# Patient Record
Sex: Female | Born: 1980 | Race: White | Hispanic: No | Marital: Married | Smoking: Current every day smoker
Health system: Southern US, Community
[De-identification: ages and names within clinical notes are randomized; demographics above are authoritative.]

## PROBLEM LIST (undated history)

## (undated) DIAGNOSIS — Z8719 Personal history of other diseases of the digestive system: Secondary | ICD-10-CM

## (undated) DIAGNOSIS — N939 Abnormal uterine and vaginal bleeding, unspecified: Secondary | ICD-10-CM

## (undated) DIAGNOSIS — J309 Allergic rhinitis, unspecified: Secondary | ICD-10-CM

## (undated) DIAGNOSIS — E669 Obesity, unspecified: Secondary | ICD-10-CM

## (undated) DIAGNOSIS — Z720 Tobacco use: Secondary | ICD-10-CM

## (undated) DIAGNOSIS — Z87898 Personal history of other specified conditions: Secondary | ICD-10-CM

## (undated) DIAGNOSIS — Z8619 Personal history of other infectious and parasitic diseases: Secondary | ICD-10-CM

## (undated) DIAGNOSIS — K635 Polyp of colon: Secondary | ICD-10-CM

## (undated) DIAGNOSIS — Z860100 Personal history of colon polyps, unspecified: Secondary | ICD-10-CM

## (undated) DIAGNOSIS — R519 Headache, unspecified: Secondary | ICD-10-CM

## (undated) DIAGNOSIS — G43909 Migraine, unspecified, not intractable, without status migrainosus: Secondary | ICD-10-CM

## (undated) DIAGNOSIS — Z8669 Personal history of other diseases of the nervous system and sense organs: Secondary | ICD-10-CM

## (undated) DIAGNOSIS — K602 Anal fissure, unspecified: Secondary | ICD-10-CM

## (undated) DIAGNOSIS — N3010 Interstitial cystitis (chronic) without hematuria: Secondary | ICD-10-CM

## (undated) DIAGNOSIS — K219 Gastro-esophageal reflux disease without esophagitis: Secondary | ICD-10-CM

## (undated) DIAGNOSIS — R002 Palpitations: Principal | ICD-10-CM

## (undated) DIAGNOSIS — Z9109 Other allergy status, other than to drugs and biological substances: Secondary | ICD-10-CM

## (undated) DIAGNOSIS — G8929 Other chronic pain: Secondary | ICD-10-CM

## (undated) DIAGNOSIS — K589 Irritable bowel syndrome without diarrhea: Secondary | ICD-10-CM

## (undated) DIAGNOSIS — I10 Essential (primary) hypertension: Secondary | ICD-10-CM

## (undated) DIAGNOSIS — Z973 Presence of spectacles and contact lenses: Secondary | ICD-10-CM

## (undated) DIAGNOSIS — R030 Elevated blood-pressure reading, without diagnosis of hypertension: Secondary | ICD-10-CM

## (undated) DIAGNOSIS — E782 Mixed hyperlipidemia: Principal | ICD-10-CM

## (undated) DIAGNOSIS — F419 Anxiety disorder, unspecified: Secondary | ICD-10-CM

## (undated) HISTORY — DX: Allergic rhinitis, unspecified: J30.9

## (undated) HISTORY — DX: Headache, unspecified: R51.9

## (undated) HISTORY — DX: Obesity, unspecified: E66.9

## (undated) HISTORY — DX: Essential (primary) hypertension: I10

## (undated) HISTORY — DX: Palpitations: R00.2

## (undated) HISTORY — DX: Other chronic pain: G89.29

## (undated) HISTORY — PX: COLONOSCOPY WITH ESOPHAGOGASTRODUODENOSCOPY (EGD): SHX5779

## (undated) HISTORY — DX: Polyp of colon: K63.5

## (undated) HISTORY — DX: Gastro-esophageal reflux disease without esophagitis: K21.9

## (undated) HISTORY — DX: Elevated blood-pressure reading, without diagnosis of hypertension: R03.0

## (undated) HISTORY — DX: Anal fissure, unspecified: K60.2

## (undated) HISTORY — DX: Mixed hyperlipidemia: E78.2

## (undated) HISTORY — DX: Personal history of other infectious and parasitic diseases: Z86.19

## (undated) HISTORY — DX: Tobacco use: Z72.0

## (undated) HISTORY — DX: Migraine, unspecified, not intractable, without status migrainosus: G43.909

## (undated) HISTORY — DX: Anxiety disorder, unspecified: F41.9

---
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
## 1999-01-12 ENCOUNTER — Emergency Department (HOSPITAL_COMMUNITY): Admission: EM | Admit: 1999-01-12 | Discharge: 1999-01-12 | Payer: Self-pay | Admitting: Emergency Medicine

## 1999-01-24 ENCOUNTER — Emergency Department (HOSPITAL_COMMUNITY): Admission: EM | Admit: 1999-01-24 | Discharge: 1999-01-24 | Payer: Self-pay | Admitting: Emergency Medicine

## 2000-07-28 ENCOUNTER — Encounter: Payer: Self-pay | Admitting: Family Medicine

## 2000-07-28 ENCOUNTER — Encounter: Admission: RE | Admit: 2000-07-28 | Discharge: 2000-07-28 | Payer: Self-pay | Admitting: Oncology

## 2002-01-31 ENCOUNTER — Encounter: Payer: Self-pay | Admitting: Emergency Medicine

## 2002-01-31 ENCOUNTER — Emergency Department (HOSPITAL_COMMUNITY): Admission: EM | Admit: 2002-01-31 | Discharge: 2002-01-31 | Payer: Self-pay | Admitting: Emergency Medicine

## 2002-02-26 ENCOUNTER — Emergency Department (HOSPITAL_COMMUNITY): Admission: EM | Admit: 2002-02-26 | Discharge: 2002-02-26 | Payer: Self-pay | Admitting: Emergency Medicine

## 2003-08-23 ENCOUNTER — Other Ambulatory Visit: Admission: RE | Admit: 2003-08-23 | Discharge: 2003-08-23 | Payer: Self-pay | Admitting: Obstetrics and Gynecology

## 2004-09-01 ENCOUNTER — Other Ambulatory Visit: Admission: RE | Admit: 2004-09-01 | Discharge: 2004-09-01 | Payer: Self-pay | Admitting: Obstetrics and Gynecology

## 2007-02-26 ENCOUNTER — Inpatient Hospital Stay (HOSPITAL_COMMUNITY): Admission: AD | Admit: 2007-02-26 | Discharge: 2007-03-02 | Payer: Self-pay | Admitting: Obstetrics & Gynecology

## 2009-04-10 ENCOUNTER — Inpatient Hospital Stay (HOSPITAL_COMMUNITY): Admission: RE | Admit: 2009-04-10 | Discharge: 2009-04-13 | Payer: Self-pay | Admitting: Obstetrics and Gynecology

## 2009-12-10 ENCOUNTER — Emergency Department (HOSPITAL_COMMUNITY): Admission: EM | Admit: 2009-12-10 | Discharge: 2009-12-11 | Payer: Self-pay | Admitting: Emergency Medicine

## 2010-09-21 LAB — RAPID STREP SCREEN (MED CTR MEBANE ONLY): Streptococcus, Group A Screen (Direct): NEGATIVE

## 2010-10-08 LAB — CBC
HCT: 28.1 % — ABNORMAL LOW (ref 36.0–46.0)
HCT: 34.6 % — ABNORMAL LOW (ref 36.0–46.0)
Hemoglobin: 11.4 g/dL — ABNORMAL LOW (ref 12.0–15.0)
MCHC: 33 g/dL (ref 30.0–36.0)
MCHC: 33.4 g/dL (ref 30.0–36.0)
MCV: 87.8 fL (ref 78.0–100.0)
MCV: 88.8 fL (ref 78.0–100.0)
Platelets: 196 10*3/uL (ref 150–400)
RBC: 3.16 MIL/uL — ABNORMAL LOW (ref 3.87–5.11)
RBC: 3.94 MIL/uL (ref 3.87–5.11)
RDW: 19.9 % — ABNORMAL HIGH (ref 11.5–15.5)
WBC: 10.3 10*3/uL (ref 4.0–10.5)
WBC: 13.1 10*3/uL — ABNORMAL HIGH (ref 4.0–10.5)

## 2010-10-08 LAB — RPR: RPR Ser Ql: NONREACTIVE

## 2010-11-17 NOTE — Op Note (Signed)
NAMEMARIENE, DICKERMAN NO.:  [id number]   MEDICAL RECORD NO.:  [id number]          PATIENT TYPE:  INP   LOCATION:  9108                          FACILITY:  [facility]   PHYSICIAN:  Juluis Mire, M.D.   DATE OF BIRTH:  06-05-1981   DATE OF PROCEDURE:  02/27/2007  DATE OF DISCHARGE:                               OPERATIVE REPORT   PREOPERATIVE DIAGNOSIS:  Intrauterine pregnancy at term with failure to  descend.   POSTOPERATIVE DIAGNOSIS:  Intrauterine pregnancy at term with failure to  descend.   PROCEDURES:  Low transverse cesarean section.   SURGEON:  Juluis Mire, M.D.   ANESTHESIA:  Epidural.   ESTIMATED BLOOD LOSS:  700 mL.   PACKS DRAINS:  None.   BLOOD REPLACED:  None.   COMPLICATIONS:  Were none.   INDICATIONS:  The patient is a 30 year old gravida 2, para 0 female at  40+ weeks for induction of labor.  Progressed to complete dilatation  after 2-3 hours pushing. Fetal vertex basically arrested at 0 station  with increasing molding. We decided proceed with primary cesarean  section for failure to descend. The risks were discussed with the risk  of infection.  The risk of hemorrhage could require transfusion with  risk of AIDS or hepatitis.  The risk of injury to adjacent organs  including bladder, bowel, ureters could require further exploratory  surgery.  Risk of deep venous thrombosis and pulmonary emboli.   DESCRIPTION OF PROCEDURE:  The patient taken to OR, placed supine  position with left lateral tilt.  After satisfactory level of epidural  anesthesia obtained abdomen prepped out Betadine and draped sterile  field.  A low transverse skin incision made knife carried through  subcutaneous tissue.  The fascia was entered sharp incision to fascia  extended laterally.  Fascia taken off the muscle superiorly and  inferiorly.  Rectus muscles were separated midline.  Peritoneum was  entered sharp incision.  The peritoneum extended both superiorly  inferiorly.  Low transverse bladder flap was developed. A low transverse  uterine incision begun with a knife and extended laterally using manual  traction.  Amniotic fluid was clear. The infant was delivered elevation  head and fundal pressure.  It was a live female weighing 8 pounds 14  ounces Apgars were 8/9.  Umbilical artery pH was 7.28. Placenta was  delivered manually.  Uterus was exteriorized for closure.  Uterus closed  with running locking suture 0 chromic using two-layer closure technique.  Some oozing from the right side brought under control figure-of-eights  of 0 chromic. Urine output remained clear and adequate.  Tubes and  ovaries were unremarkable. Returned the uterus to the abdominal cavity.  We irrigated the pelvis had good hemostasis.  Muscles and peritoneum  closed with a suture of 3-  0 Vicryl.  Fascia closed with a suture of 0 PDS.  Skin was closed  staples and Steri-Strips.  Sponge, instrument and needle count was  correct by circulating nurse x2.  Foley catheter remained clear at time  of closure.  The patient did tolerate procedure  well and returned to the  recovery room in good condition.      Juluis Mire, M.D.  Electronically Signed     JSM/MEDQ  D:  02/27/2007  T:  02/27/2007  Job:  [id number]

## 2010-11-20 NOTE — Discharge Summary (Signed)
[name], Diana James NO.:  [id number]   MEDICAL RECORD NO.:  [id number]          PATIENT TYPE:  INP   LOCATION:  9108                          FACILITY:  [facility]   PHYSICIAN:  Zelphia Cairo, MD    DATE OF BIRTH:  18-Jun-1981   DATE OF ADMISSION:  02/26/2007  DATE OF DISCHARGE:  03/02/2007                               DISCHARGE SUMMARY   ADMITTING DIAGNOSIS:  1. Intrauterine pregnancy at term.  2. Induction of labor.   DISCHARGE DIAGNOSIS:  1. Status post low-transverse cesarean section secondary to failure to      descend.  2. Viable female infant.   PROCEDURE:  Primary low-transverse cesarean section.   REASON FOR ADMISSION:  Please see written H&P.   HOSPITAL COURSE:  The patient is a 25-year gravida 2, para 0 who was  admitted to [facility] at 40+ weeks for an induction of  labor.  The patient did progress to complete dilatation.  After pushing  for approximately 2-3 hours with no further descent greater than a zero  station, decision was made to proceed with a primary low-transverse  cesarean section.  The patient was then transferred to the operating  room where epidural was dosed to an adequate surgical level.   A low-transverse incision was made with delivery of a viable female  infant weighing 8 pounds 14 ounces Apgars of 8 at one minute and 9 at  five minutes.  Umbilical artery pH was 7.28.  The patient tolerated the  procedure well and was taken to the recovery room in stable condition.  On postoperative day #1 the patient was without complaint, vital signs  were stable, abdomen soft, fundus firm and nontender.  Abdominal  dressing was noted to have a small amount of drainage noted on the  bandage.  Laboratory findings showed a hemoglobin of 10.3, platelet  count 173,000, WBC of 16.2, blood type was found to be O+.   On postoperative day #2 the patient was without complaint.  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  Vital signs were  otherwise stable.  She was afebrile, abdomen soft, fundus firm and  nontender.  Abdominal dressing was partially removed revealing an  incision that was clean, dry, and intact.  The patient was ambulating  well.   On postoperative day #3 the patient was without complaint.  Vital signs  were stable.  She was afebrile.  Heart rate was 96-111.  Lungs were  clear to auscultation.  Heart was regular rate and rhythm with a mid  systolic flow murmur.  Abdomen was soft.  Fundus was firm and nontender.  Erythema was noted superior to the incisional site which was slightly  tender.  Incision was clean, dry,  and intact.  Staples were intact.  The patient was later discharged home.   CONDITION ON DISCHARGE:  Stable.   DIET:  Regular as tolerated.   ACTIVITY:  No heavy lifting, no driving x2 weeks, no vaginal entry.   FOLLOWUP:  Patient to followup in the office in 2-3 days for staple  removal.  She is to call for temperature greater than 100 degrees,  persistent nausea, vomiting, heavy vaginal bleeding and/or redness or  drainage from the incisional site.   DISCHARGE MEDICATIONS:  1. Keflex 5 mg one p.o. q.i.d. x7 days.  2. Percocet 5/325 numbers 30 one p.o. q.4-6 h. p.r.n.  3. Prenatal vitamins one p.o. daily.      Julio Sicks, N.P.      Zelphia Cairo, MD  Electronically Signed    CC/MEDQ  D:  04/03/2007  T:  04/03/2007  Job:  [id number]

## 2011-04-16 LAB — CBC
HCT: 36
Hemoglobin: 9.5 — ABNORMAL LOW
MCHC: 34.1
MCHC: 34.2
MCHC: 34.4
MCV: 88.6
Platelets: 173
Platelets: 193
RBC: 3.09 — ABNORMAL LOW
RDW: 14.9 — ABNORMAL HIGH
RDW: 15.1 — ABNORMAL HIGH
WBC: 13.4 — ABNORMAL HIGH

## 2011-04-16 LAB — RPR: RPR Ser Ql: NONREACTIVE

## 2012-08-24 ENCOUNTER — Ambulatory Visit (INDEPENDENT_AMBULATORY_CARE_PROVIDER_SITE_OTHER): Payer: No Typology Code available for payment source | Admitting: Cardiovascular Disease

## 2012-08-24 ENCOUNTER — Encounter: Payer: Self-pay | Admitting: Cardiovascular Disease

## 2012-08-24 VITALS — BP 115/78 | HR 75 | Resp 12 | Ht 64.0 in

## 2012-08-24 DIAGNOSIS — R002 Palpitations: Secondary | ICD-10-CM

## 2012-08-24 NOTE — Progress Notes (Signed)
   History of Present Illness: 32 yo female with no significant past medical history who is referred today for evaluation of palpitations. She has been seen recently by Dr. Richardean Chimera ([facility]) and has had c/o palpitations. She describes episodes of feeling her heart race and subsequent facial numbness with chest fullness, dyspnea and diaphoresis. She has been having these symptoms for several months. No near syncope or syncope. She does drink several cups of coffee per day and she smokes 3 cigarettes per day. Denies exertional chest pain or pressure.   Primary Care Physician: Richardean Chimera [facility])  Past Medical History  Diagnosis Date  . Palpitations   . Anxiety     Past Surgical History  Procedure Laterality Date  . Tonsillectomy    . Cesarean section      x2    Current Outpatient Prescriptions  Medication Sig Dispense Refill  . ALPRAZolam (XANAX) 0.25 MG tablet Take 0.25 mg by mouth 3 (three) times daily as needed for sleep.      . citalopram (CELEXA) 20 MG tablet Take 30 mg by mouth daily.       No current facility-administered medications for this visit.    No Known Allergies  History   Social History  . Marital Status: Married    Spouse Name: N/A    Number of Children: N/A  . 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 Smoking status: Current Every Day Smoker -- 0.20 packs/day for 15 years    Types: Cigarettes  . Smokeless tobacco: Not on file  . Alcohol Use: 6.0 oz/week    12 drink(s) per week  . Drug Use: No  . Sexually Active: Not on file   Other Topics Concern  . Not on file   Social History Narrative  . No narrative on file    Family History  Problem Relation Age of Onset  . Cancer - Other Father     Throat  . CAD Neg Hx     Review of Systems:  As stated in the HPI and otherwise negative.   BP 115/78  Pulse 75  Resp 12  Physical Examination: General: Well  developed, well nourished, NAD HEENT: OP clear, mucus membranes moist SKIN: warm, dry. No rashes. Neuro: No focal deficits Musculoskeletal: Muscle strength 5/5 all ext Psychiatric: Mood and affect normal Neck: No JVD, no carotid bruits, no thyromegaly, no lymphadenopathy. Lungs:Clear bilaterally, no wheezes, rhonci, crackles Cardiovascular: Regular rate and rhythm. No murmurs, gallops or rubs. Abdomen:Soft. Bowel sounds present. Non-tender.  Extremities: No lower extremity edema. Pulses are 2 + in the bilateral DP/PT.  EKG: NSR, rate 91 bpm.   Assessment and Plan:   1. Palpitations: She has symptoms consistent with possible arrythmia. Will arrange 48 hour Holter monitor. Will also arrange echo to exclude structural heart disease. She will avoid caffeine, stimulants and she will try to stop smoking.   2. Tobacco abuse: Cessation counseling today.

## 2012-08-24 NOTE — Patient Instructions (Addendum)
Your physician recommends that you schedule a follow-up appointment in:  4 weeks.   Your physician has recommended that you wear a holter monitor. Holter monitors are medical devices that record the heart's electrical activity. Doctors most often use these monitors to diagnose arrhythmias. Arrhythmias are problems with the speed or rhythm of the heartbeat. The monitor is a small, portable device. You can wear one while you do your normal daily activities. This is usually used to diagnose what is causing palpitations/syncope (passing out).  Your physician has requested that you have an echocardiogram. Echocardiography is a painless test that uses sound waves to create images of your heart. It provides your doctor with information about the size and shape of your heart and how well your heart's chambers and valves are working. This procedure takes approximately one hour. There are no restrictions for this procedure.

## 2012-08-28 ENCOUNTER — Encounter: Payer: Self-pay | Admitting: Cardiovascular Disease

## 2012-08-30 ENCOUNTER — Telehealth: Payer: Self-pay | Admitting: *Deleted

## 2012-08-30 ENCOUNTER — Encounter (INDEPENDENT_AMBULATORY_CARE_PROVIDER_SITE_OTHER): Payer: No Typology Code available for payment source

## 2012-08-30 ENCOUNTER — Ambulatory Visit (HOSPITAL_COMMUNITY): Payer: No Typology Code available for payment source | Attending: Cardiology

## 2012-08-30 DIAGNOSIS — R002 Palpitations: Secondary | ICD-10-CM

## 2012-08-30 DIAGNOSIS — I059 Rheumatic mitral valve disease, unspecified: Secondary | ICD-10-CM | POA: Insufficient documentation

## 2012-08-30 NOTE — Telephone Encounter (Signed)
48 hr holter monitor placed on Pt 08/30/12 TK

## 2012-08-30 NOTE — Progress Notes (Signed)
Echocardiogram performed.  

## 2012-09-20 ENCOUNTER — Ambulatory Visit (INDEPENDENT_AMBULATORY_CARE_PROVIDER_SITE_OTHER): Payer: No Typology Code available for payment source | Admitting: Cardiovascular Disease

## 2012-09-20 ENCOUNTER — Encounter: Payer: Self-pay | Admitting: Cardiovascular Disease

## 2012-09-20 VITALS — BP 119/70 | HR 82 | Ht 64.0 in | Wt 195.0 lb

## 2012-09-20 DIAGNOSIS — R0789 Other chest pain: Secondary | ICD-10-CM

## 2012-09-20 DIAGNOSIS — F17200 Nicotine dependence, unspecified, uncomplicated: Secondary | ICD-10-CM

## 2012-09-20 DIAGNOSIS — R002 Palpitations: Secondary | ICD-10-CM

## 2012-09-20 DIAGNOSIS — Z720 Tobacco use: Secondary | ICD-10-CM

## 2012-09-20 NOTE — Progress Notes (Signed)
History of Present Illness: 32 yo female with no significant past medical history who is here today for cardiac follow up. She was seen as a new patient 08/24/12 for evaluation of palpitations. She had been seen Dr. Richardean Chimera ([facility]) and had c/o palpitations. She described episodes of feeling her heart race and subsequent facial numbness with chest fullness, dyspnea and diaphoresis. She has been having these symptoms for several months. No near syncope or syncope. She does drink several cups of coffee per day and she smokes 3 cigarettes per day. Denies exertional chest pain or pressure. I arranged an echocardiogram and a 48 Hour Holter monitor. Echo was normal with LVEF of 60-65%. 48 hour monitor with normal sinus rhythm, rare PAC.   She is here today for f/u. She is feeling better. Still has occasional left sided chest aching at rest. Rare episodes of fluttering, including when she was wearing her monitor.   Primary Care Physician: Richardean Chimera [facility])  Past Medical History  Diagnosis Date  . Palpitations   . Anxiety     Past Surgical History  Procedure Laterality Date  . Tonsillectomy    . Cesarean section      x2    Current Outpatient Prescriptions  Medication Sig Dispense Refill  . ALPRAZolam (XANAX) 0.25 MG tablet Take 0.25 mg by mouth 3 (three) times daily as needed for sleep.      . citalopram (CELEXA) 20 MG tablet Take 30 mg by mouth daily.       No current facility-administered medications for this visit.    No Known Allergies  History   Social History  . Marital Status: Married    Spouse Name: N/A    Number of Children: N/A  . 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 Smoking status: Current Every Day Smoker -- 0.20 packs/day for 15 years    Types: Cigarettes  . Smokeless tobacco: Not on file  . Alcohol Use: 6.0 oz/week    12 drink(s) per week  . Drug Use: No  . Sexually  Active: Not on file   Other Topics Concern  . Not on file   Social History Narrative  . No narrative on file    Family History  Problem Relation Age of Onset  . Cancer - Other Father     Throat  . CAD Neg Hx     Review of Systems:  As stated in the HPI and otherwise negative.   BP 119/70  Pulse 82  Ht 5\' 4"  (1.626 m)  Wt 195 lb (88.451 kg)  BMI 33.46 kg/m2  Physical Examination: General: Well developed, well nourished, NAD HEENT: OP clear, mucus membranes moist SKIN: warm, dry. No rashes. Neuro: No focal deficits Musculoskeletal: Muscle strength 5/5 all ext Psychiatric: Mood and affect normal Neck: No JVD, no carotid bruits, no thyromegaly, no lymphadenopathy. Lungs:Clear bilaterally, no wheezes, rhonci, crackles Cardiovascular: Regular rate and rhythm. No murmurs, gallops or rubs. Abdomen:Soft. Bowel sounds present. Non-tender.  Extremities: No lower extremity edema. Pulses are 2 + in the bilateral DP/PT.  Echo 08/30/12: Left ventricle: The cavity size was normal. Wall thickness was normal. Systolic function was normal. The estimated ejection fraction was in the range of 60% to 65%.  Assessment and Plan:   1. Palpitations: Likely premature beats, rare on 48 hour monitor. LV function is normal.   2. Atypical chest pain: Does not appear cardiac.  Most likely GERD. She will try Pepcid daily.   3. Tobacco abuse: She has stopped smoking and is using an electronic cigarette.

## 2012-09-20 NOTE — Patient Instructions (Addendum)
Your physician recommends that you schedule a follow-up appointment as needed with Dr. McAlhany   

## 2012-11-08 ENCOUNTER — Other Ambulatory Visit: Payer: Self-pay | Admitting: Obstetrics and Gynecology

## 2012-11-08 DIAGNOSIS — N644 Mastodynia: Secondary | ICD-10-CM

## 2012-11-20 ENCOUNTER — Ambulatory Visit
Admission: RE | Admit: 2012-11-20 | Discharge: 2012-11-20 | Disposition: A | Discharge status: Home / Self Care | Payer: No Typology Code available for payment source | Source: Ambulatory Visit | Attending: Obstetrics and Gynecology | Admitting: Obstetrics and Gynecology

## 2012-11-20 ENCOUNTER — Ambulatory Visit: Admission: RE | Admit: 2012-11-20 | Payer: No Typology Code available for payment source | Source: Ambulatory Visit

## 2012-11-20 DIAGNOSIS — N644 Mastodynia: Secondary | ICD-10-CM

## 2014-02-07 ENCOUNTER — Encounter: Payer: Self-pay | Admitting: Family Medicine

## 2014-02-07 ENCOUNTER — Ambulatory Visit (INDEPENDENT_AMBULATORY_CARE_PROVIDER_SITE_OTHER): Payer: 59 | Admitting: Family Medicine

## 2014-02-07 VITALS — BP 110/80 | HR 105 | Temp 98.6°F | Ht 63.75 in | Wt 204.0 lb

## 2014-02-07 DIAGNOSIS — Z7189 Other specified counseling: Secondary | ICD-10-CM

## 2014-02-07 DIAGNOSIS — Z9109 Other allergy status, other than to drugs and biological substances: Secondary | ICD-10-CM

## 2014-02-07 DIAGNOSIS — R159 Full incontinence of feces: Secondary | ICD-10-CM

## 2014-02-07 DIAGNOSIS — K529 Noninfective gastroenteritis and colitis, unspecified: Secondary | ICD-10-CM

## 2014-02-07 DIAGNOSIS — R197 Diarrhea, unspecified: Secondary | ICD-10-CM

## 2014-02-07 DIAGNOSIS — Z7689 Persons encountering health services in other specified circumstances: Secondary | ICD-10-CM

## 2014-02-07 MED ORDER — FLUTICASONE PROPIONATE 50 MCG/ACT NA SUSP: 2.0000 | Freq: Every day | NASAL | Status: DC

## 2014-02-07 NOTE — Patient Instructions (Addendum)
-  call for appointment with allergist - in interim start flonase 2 sprays each nostril daily  -We placed a referral for you as discussed to the gastroenterologist. It usually takes about 1-2 weeks to process and schedule this referral. If you have not heard from [place] regarding this appointment in 2 weeks please contact our office.  -call quit coach in interim  -follow up in about 1-2 months for morning appointment - we will get fasting labs that day and talk about the smoking

## 2014-02-07 NOTE — Progress Notes (Signed)
No chief complaint on file.   HPI:  Diana James is here to establish care.  Last PCP and physical: sees Dr. Radene Knee - utd on yearly exams   Has the following chronic problems and concerns today:  Patient Active Problem List   Diagnosis Date Noted  . Atypical chest pain 09/20/2012  . Tobacco abuse 09/20/2012  . Palpitations 08/24/2012     PND: -started several months ago -had abx for sinusitis - then got  -has history of allergic rhinitis (on allegra in the past), recurrent sinus infections (several a year for many many years) -currently: PND, ears full, clear rhinitis, sneezing, itchy watery eyes, cough -denies: fevers, tooth pain, vomiting, nausea, sinus pain, heartburn, reflux  Chronic diarrhea -has had diarrhea (loose and floating stools, dark brown to green)on and off for years, recurrent and chronic abd pain -also had chronic anal leakage for several years -put on anxiety medication and did not help, now on effexor - told IBS, never saw a gastroenterologist -wants referral to gastroenterology -denies: melena, hematochezia, vomiting  Anxiety/Hot flashes: -effexor and xanax rxd by pscyh -uses xanax a few times per month -has had hormone labs done with gyn and normal  Tobacco Use: -she has tried cold [place] -has not tried chantix or wellbutrin -has not had quit coach -wants to quit    ROS negative for unless reported above: fevers, unintentional weight loss, hearing or vision loss, chest pain, palpitations, struggling to breath, hemoptysis, melena, hematochezia, hematuria, falls, loc, si, thoughts of self harm  Past Medical History  Diagnosis Date  . Palpitations   . Anxiety   . Anxiety     Family History  Problem Relation Age of Onset  . Cancer - Other Father     Throat  . CAD Neg Hx   . Hypertension Mother   . Hyperlipidemia Mother   . Mental illness Mother   . Diabetes Paternal Grandfather     History   Social History  . Marital Status: Married     Spouse Name: N/A    Number of Children: N/A  . 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 Smoking status: Current Every Day Smoker -- 0.20 packs/day for 15 years    Types: Cigarettes  . Smokeless tobacco: None  . Alcohol Use: 6.0 oz/week    12 drink(s) per week  . Drug Use: No  . Sexual Activity: None   Other Topics Concern  . None   Social History Narrative   Work or School: [employer]      Home Situation: lives with husband and two children      Spiritual Beliefs: none      Lifestyle: no regular exercise; diet is terrible             Current outpatient prescriptions:venlafaxine (EFFEXOR) 75 MG tablet, Take 75 mg by mouth daily., Disp: , Rfl: ;  ALPRAZolam (XANAX) 0.25 MG tablet, Take 0.25 mg by mouth 3 (three) times daily as needed for sleep., Disp: , Rfl: ;  fluticasone (FLONASE) 50 MCG/ACT nasal spray, Place 2 sprays into both nostrils daily., Disp: 16 g, Rfl: 1  EXAM:  Filed Vitals:   02/07/14 1430  BP: 110/80  Pulse: 105  Temp: 98.6 F (37 C)    Body mass index is 35.3 kg/(m^2).  GENERAL: vitals reviewed and listed above, alert, oriented, appears well hydrated and in no acute distress  HEENT:  atraumatic, conjunttiva clear, no obvious abnormalities on inspection of external nose and ears  NECK: no obvious masses on inspection  LUNGS: clear to auscultation bilaterally, no wheezes, rales or rhonchi, good air movement  CV: HRRR, no peripheral edema  MS: moves all extremities without noticeable abnormality  PSYCH: pleasant and cooperative, no obvious depression or anxiety  ASSESSMENT AND PLAN:  Discussed the following assessment and plan:  Environmental allergies - Plan: fluticasone (FLONASE) 50 MCG/ACT nasal spray  Encounter to establish care  Chronic diarrhea - Plan: Ambulatory referral to Gastroenterology  Fecal incontinence - Plan: Ambulatory referral to  Gastroenterology  -We reviewed the PMH, PSH, FH, SH, Meds and Allergies. -We provided refills for any medications we will prescribe as needed. -We addressed current concerns per orders and patient instructions. -We have asked for records for pertinent exams, studies, vaccines and notes from previous providers. -We have advised patient to follow up per instructions below.   -Patient advised to return or notify a doctor immediately if symptoms worsen or persist or new concerns arise.  Patient Instructions  -call for appointment with allergist - in interim start flonase 2 sprays each nostril daily  -We placed a referral for you as discussed to the gastroenterologist. It usually takes about 1-2 weeks to process and schedule this referral. If you have not heard from [place] regarding this appointment in 2 weeks please contact our office.  -call quit coach in interim  -follow up in about 1-2 months for morning appointment - we will get fasting labs that day and talk about the smoking     Doren Kaspar R.

## 2014-02-07 NOTE — Progress Notes (Signed)
Pre visit review using our clinic review tool, if applicable. No additional management support is needed unless otherwise documented below in the visit note. 

## 2014-02-08 ENCOUNTER — Encounter: Payer: Self-pay | Admitting: Nurse Practitioner

## 2014-02-08 ENCOUNTER — Telehealth: Payer: Self-pay | Admitting: Family Medicine

## 2014-02-08 NOTE — Telephone Encounter (Signed)
Relevant patient education assigned to patient using Emmi. ° °

## 2014-02-20 ENCOUNTER — Encounter: Payer: Self-pay | Admitting: Nurse Practitioner

## 2014-02-20 ENCOUNTER — Ambulatory Visit (INDEPENDENT_AMBULATORY_CARE_PROVIDER_SITE_OTHER): Payer: 59 | Admitting: Nurse Practitioner

## 2014-02-20 ENCOUNTER — Other Ambulatory Visit (INDEPENDENT_AMBULATORY_CARE_PROVIDER_SITE_OTHER): Payer: 59

## 2014-02-20 VITALS — BP 112/76 | HR 100 | Ht 63.75 in | Wt 206.0 lb

## 2014-02-20 DIAGNOSIS — R198 Other specified symptoms and signs involving the digestive system and abdomen: Secondary | ICD-10-CM

## 2014-02-20 DIAGNOSIS — R194 Change in bowel habit: Secondary | ICD-10-CM

## 2014-02-20 DIAGNOSIS — K602 Anal fissure, unspecified: Secondary | ICD-10-CM

## 2014-02-20 LAB — IGA: IgA: 230 mg/dL (ref 68–378)

## 2014-02-20 MED ORDER — HYOSCYAMINE SULFATE 0.125 MG SL SUBL: SUBLINGUAL_TABLET | SUBLINGUAL | Status: DC

## 2014-02-20 MED ORDER — DILTIAZEM GEL 2 %: 1.0000 "application " | Freq: Three times a day (TID) | CUTANEOUS | Status: DC

## 2014-02-20 NOTE — Patient Instructions (Signed)
Please go to the basement level to have your labs drawn.   Take Citrucel fiber daily. You can mix this in juice. We sent a prescription for Diltiazem Gel to [place] across parking lot from [place] store. [phone] We sent the Levsin tablets to [facility] [address]  IF you have not improved in 1 month with symptoms and altered bowel habits call us and we may consider ordering a colonoscopy.

## 2014-02-20 NOTE — Progress Notes (Signed)
HPI :  Patient is a 33 year old female, new to this practice, referred for evaluation of diarrhea. She gives a several week history of altered bowel habits but after further questioning this appears to have been going on for years though more pronounced over last several weeks. She has 3 or 4 loose, small volume stools throughout the day, sometimes associated with cramping. No significant nocturnal diarrhea. Occasionally patients  Goes a couple of days without a BM which she considers to be constipation. Stools are nonbloody. She has no fever. No associated weight loss. Patient does admit to some rectal discomfort with BMs as well as occasional bleeding when she wipes. She also has problems with fecal leakage, especially just after defecation.   Past Medical History  Diagnosis Date  . Palpitations   . Anxiety   . Allergic rhinitis    Past Surgical History  Procedure Laterality Date  . Tonsillectomy    . Cesarean section      x2    Family History  Problem Relation Age of Onset  . Cancer - Other Father     Throat  . CAD Neg Hx   . Hypertension Mother   . Hyperlipidemia Mother   . Mental illness Mother   . Celiac disease Mother   . Diabetes Paternal Grandfather    History  Substance Use Topics  . Smoking status: Current Every Day Smoker -- 0.20 packs/day for 15 years    Types: Cigarettes  . Smokeless tobacco: Never Used  . Alcohol Use: 6.0 oz/week    12 drink(s) per week   Current Outpatient Prescriptions  Medication Sig Dispense Refill  . ALPRAZolam (XANAX) 0.25 MG tablet Take 0.25 mg by mouth 3 (three) times daily as needed for sleep.      . cetirizine (ZYRTEC) 10 MG tablet Take 10 mg by mouth daily.      . fluticasone (FLONASE) 50 MCG/ACT nasal spray Place 2 sprays into both nostrils daily.  16 g  1  . venlafaxine (EFFEXOR) 75 MG tablet Take 75 mg by mouth daily.       No current facility-administered medications for this visit.   Allergies  Allergen Reactions  .  Cefdinir Hives  . Other    Review of Systems: Positive for allergies/sinus trouble, anxiety, back pain, cough, fatigue, headaches and menstrual pain, muscle pains, night sweats and urine leakageAll systems reviewed and negative except where noted in HPI.    Physical Exam: BP 112/76  Pulse 100  Ht 5' 3.75" (1.619 m)  Wt 206 lb (93.441 kg)  BMI 35.65 kg/m2  LMP 01/25/2014 Constitutional: Pleasant, obese, white female in no acute distress. HEENT: Normocephalic and atraumatic. Conjunctivae are normal. No scleral icterus. Neck supple.  Cardiovascular: Normal rate, regular rhythm.  Pulmonary/chest: Effort normal and breath sounds normal. No wheezing, rales or rhonchi. Abdominal: Soft, nondistended, nontender. Bowel sounds active throughout. There are no masses palpable. No hepatomegaly. Rectal: superficial posterior midline anal fissure Extremities: no edema Lymphadenopathy: No cervical adenopathy noted. Neurological: Alert and oriented to person place and time. Skin: Skin is warm and dry. No rashes noted. Psychiatric: Normal mood and affect. Behavior is normal.   ASSESSMENT AND PLAN:   #65. 32 year old female with chronically altered bowel habits, (mainly in the form of loose stool) with associated abdominal cramping.  No unusual weight loss, abdominal exam is benign.   Doubt celiac disease but since mother has it, will check appropriate labs.   I suspect this is irritable bowel syndrome. Will  try Citrucel since there does seem to be a component of constipation here. Trial of Hyoscyamine for cramps. If no improvement over the next 4-6 weeks will proceed with a colonoscopy. Patient to call with condition update.  #2. Rectal pain, occasional bleeding per disease when wiping). She appears to have a shallow posterior midline anal fissure. The area is very tender so I DRE not done this visit.  Will treat with Diltiazem Gel for 4 weeks. Advised against straining.  #3. Fecal seepage. I did  not do a full rectal exam because of pain related to what is probably a fissure. We did discuss causes of fecal seepage including, but not limited to, internal hemorrhoids, fecal impaction, etc..Problem may resolve if stools can be bulked up with citrucel.  Otherwise will evaluate further once fissure heals (or if she ends up having a colonoscopy).

## 2014-02-21 ENCOUNTER — Encounter: Payer: Self-pay | Admitting: Family Medicine

## 2014-02-21 ENCOUNTER — Encounter: Payer: Self-pay | Admitting: Nurse Practitioner

## 2014-02-21 DIAGNOSIS — R194 Change in bowel habit: Secondary | ICD-10-CM | POA: Insufficient documentation

## 2014-02-21 DIAGNOSIS — K602 Anal fissure, unspecified: Secondary | ICD-10-CM | POA: Insufficient documentation

## 2014-02-22 LAB — T-TRANSGLUTAMINASE (TTG) IGG: Tissue Transglut Ab: 2 U/mL (ref 0–5)

## 2014-02-22 NOTE — Progress Notes (Signed)
Reviewed and agree with management. Matthan Sledge D. Braden Cimo, M.D., FACG  

## 2014-03-13 ENCOUNTER — Encounter: Payer: Self-pay | Admitting: Family Medicine

## 2014-03-13 ENCOUNTER — Ambulatory Visit (INDEPENDENT_AMBULATORY_CARE_PROVIDER_SITE_OTHER): Payer: 59 | Admitting: Family Medicine

## 2014-03-13 VITALS — BP 124/80 | HR 97 | Temp 98.4°F | Ht 63.75 in | Wt 209.0 lb

## 2014-03-13 DIAGNOSIS — K529 Noninfective gastroenteritis and colitis, unspecified: Secondary | ICD-10-CM

## 2014-03-13 DIAGNOSIS — E669 Obesity, unspecified: Secondary | ICD-10-CM

## 2014-03-13 DIAGNOSIS — E785 Hyperlipidemia, unspecified: Secondary | ICD-10-CM

## 2014-03-13 DIAGNOSIS — R209 Unspecified disturbances of skin sensation: Secondary | ICD-10-CM

## 2014-03-13 DIAGNOSIS — R202 Paresthesia of skin: Secondary | ICD-10-CM

## 2014-03-13 DIAGNOSIS — K602 Anal fissure, unspecified: Secondary | ICD-10-CM

## 2014-03-13 DIAGNOSIS — R200 Anesthesia of skin: Secondary | ICD-10-CM

## 2014-03-13 DIAGNOSIS — Z23 Encounter for immunization: Secondary | ICD-10-CM

## 2014-03-13 DIAGNOSIS — R197 Diarrhea, unspecified: Secondary | ICD-10-CM

## 2014-03-13 LAB — BASIC METABOLIC PANEL
BUN: 9 mg/dL (ref 6–23)
CHLORIDE: 105 meq/L (ref 96–112)
CO2: 25 mEq/L (ref 19–32)
CREATININE: 0.8 mg/dL (ref 0.4–1.2)
Calcium: 9.1 mg/dL (ref 8.4–10.5)
GFR: 82.62 mL/min (ref >60.00)
Glucose, Bld: 86 mg/dL (ref 70–99)
POTASSIUM: 4.3 meq/L (ref 3.5–5.1)
Sodium: 137 mEq/L (ref 135–145)

## 2014-03-13 LAB — LIPID PANEL
CHOL/HDL RATIO: 6
Cholesterol: 162 mg/dL (ref 0–200)
HDL: 25.9 mg/dL — ABNORMAL LOW (ref >39.00)
NONHDL: 136.1
Triglycerides: 234 mg/dL — ABNORMAL HIGH (ref 0.0–149.0)
VLDL: 46.8 mg/dL — AB (ref 0.0–40.0)

## 2014-03-13 LAB — LDL CHOLESTEROL, DIRECT: LDL DIRECT: 109.8 mg/dL

## 2014-03-13 NOTE — Progress Notes (Signed)
Pre visit review using our clinic review tool, if applicable. No additional management support is needed unless otherwise documented below in the visit note. 

## 2014-03-13 NOTE — Patient Instructions (Signed)
-  please try to not rest elbow on table  -try not to sleep on the R arm - follow up if persists or worsens  --We have ordered labs or studies at this visit. It can take up to 1-2 weeks for results and processing. We will contact you with instructions IF your results are abnormal. Normal results will be released to your [facility]. If you have not heard from [place] or can not find your results in [facility] in 2 weeks please contact our office.  -PLEASE SIGN UP FOR MYCHART TODAY   We recommend the following healthy lifestyle measures: - eat a healthy diet consisting of lots of vegetables, fruits, beans, nuts, seeds, healthy meats such as white chicken and fish and whole grains.  - avoid fried foods, fast food, processed foods, sodas, red meet and other fattening foods.  - get a least 150 minutes of aerobic exercise per week.   Follow up in: 6 months or as needed

## 2014-03-13 NOTE — Addendum Note (Signed)
Addended by: Agnes Lawrence on: 03/13/2014 10:01 AM   Modules accepted: Orders

## 2014-03-13 NOTE — Progress Notes (Signed)
No chief complaint on file.   HPI:  Tingling in R hand: -just started this morning after sleeping on hand wrong -in R pinky and medial hand -no weakness or numbness -rests elbow on table frquently  PND/Allergic Rhinnitis: -reports: saw allergist, doing skin testing, flonase, zyrtec, zaditor -allergic to lots of trees, mold, cockroaches, feeling better -denies: wheezing, struggling to breath  Chronic diarrhea and fecal seepage: -saw GI 02/20/14, notes reviewed - doing citrucel, hyoscyamine and diltiazem gel with follow up after labs -reports: doing a little better on the citrucel  -still having having the fecal seepage  Anxiety and hot flashes: -managed by her gynecologist  ? HLD: -wants to check basic labs today  Obesity: -not really doing anything for this currently   ROS: See pertinent positives and negatives per HPI.  Past Medical History  Diagnosis Date  . Palpitations   . Anxiety   . Allergic rhinitis     Past Surgical History  Procedure Laterality Date  . Tonsillectomy    . Cesarean section      x2    Family History  Problem Relation Age of Onset  . Cancer - Other Father     Throat  . CAD Neg Hx   . Hypertension Mother   . Hyperlipidemia Mother   . Mental illness Mother   . Celiac disease Mother   . Diabetes Paternal Grandfather     History   Social History  . Marital Status: Married    Spouse Name: N/A    Number of Children: N/A  . 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 Smoking status: Current Every Day Smoker -- 0.20 packs/day for 15 years    Types: Cigarettes  . Smokeless tobacco: Never Used  . Alcohol Use: 6.0 oz/week    12 drink(s) per week  . Drug Use: No  . Sexual Activity: None   Other Topics Concern  . None   Social History Narrative   Work or School: [employer]      Home Situation: lives with husband and two children      Spiritual Beliefs:  none      Lifestyle: no regular exercise; diet is terrible             Current outpatient prescriptions:ALPRAZolam (XANAX) 0.25 MG tablet, Take 0.25 mg by mouth 3 (three) times daily as needed for sleep., Disp: , Rfl: ;  cetirizine (ZYRTEC) 10 MG tablet, Take 10 mg by mouth daily., Disp: , Rfl: ;  diltiazem 2 % GEL, Apply 1 application topically 3 (three) times daily., Disp: 30 g, Rfl: 0;  fluticasone (FLONASE) 50 MCG/ACT nasal spray, Place 2 sprays into both nostrils daily., Disp: 16 g, Rfl: 1 hyoscyamine (LEVSIN/SL) 0.125 MG SL tablet, Take 1 tab on the tongue 2-3 times daily as needed for cramping , abdominal pain., Disp: 30 tablet, Rfl: 0;  venlafaxine (EFFEXOR) 75 MG tablet, Take 75 mg by mouth daily., Disp: , Rfl:   EXAM:  Filed Vitals:   03/13/14 0843  BP: 124/80  Pulse: 97  Temp: 98.4 F (36.9 C)    Body mass index is 36.17 kg/(m^2).  GENERAL: vitals reviewed and listed above, alert, oriented, appears well hydrated and in no acute distress  HEENT: atraumatic, conjunttiva clear, no obvious abnormalities on inspection of external nose and ears  NECK: no obvious masses on inspection  LUNGS: clear to auscultation bilaterally, no wheezes, rales  or rhonchi, good air movement  CV: HRRR, no peripheral edema  MS/NEURO: moves all extremities without noticeable abnormality -normal exam of both UE bilat with normal sensation to light touch and normal strength, neg tinels of ulnar and median nerve  PSYCH: pleasant and cooperative, no obvious depression or anxiety  ASSESSMENT AND PLAN:  Discussed the following assessment and plan:  Other and unspecified hyperlipidemia - Plan: Lipid Panel, Basic metabolic panel  Numbness and tingling in right hand -we discussed possible serious and likely etiologies, workup and treatment, treatment risks and return precautions -after this discussion, Jaynia opted for monitoring and eliminating  -follow up advised if needed -of course, we  advised Leland  to return or notify a doctor immediately if symptoms worsen or persist or new concerns arise.  Chronic diarrhea - Plan: Basic metabolic panel Anal fissure -seeing GI  Obesity, unspecified -discussed options, lifestyle, meds -may consider health coach if HLD -declined nutrition counseling  -flu and tdap today  -Patient advised to return or notify a doctor immediately if symptoms worsen or persist or new concerns arise.  There are no Patient Instructions on file for this visit.   Colin Benton R.

## 2014-06-17 ENCOUNTER — Ambulatory Visit (INDEPENDENT_AMBULATORY_CARE_PROVIDER_SITE_OTHER): Payer: 59 | Admitting: Family Medicine

## 2014-06-17 ENCOUNTER — Encounter: Payer: Self-pay | Admitting: Family Medicine

## 2014-06-17 VITALS — BP 107/71 | HR 86 | Temp 97.4°F | Ht 63.75 in | Wt 203.0 lb

## 2014-06-17 DIAGNOSIS — S39012A Strain of muscle, fascia and tendon of lower back, initial encounter: Secondary | ICD-10-CM

## 2014-06-17 MED ORDER — HYDROCODONE-ACETAMINOPHEN 5-325 MG PO TABS: 1.0000 | ORAL_TABLET | Freq: Four times a day (QID) | ORAL | Status: DC | PRN

## 2014-06-17 NOTE — Progress Notes (Signed)
OFFICE NOTE  06/17/2014  CC:  Chief Complaint  Patient presents with  . Back Pain     HPI: Patient is a 33 y.o. Caucasian female who is here for back pain. Last night bent over to run bath for her children, felt back instantly hurt and it has hurt constantly since then.  Pain located centrally in lower L spine.  It does not radiate anywhere.  No paresthesias down legs or up back or around abdomen.   Has had some back aches in the area before but nothing like this where her back has "gone out" on her.  Pertinent PMH:  REviewed PMH and PSH. MEDS:  Outpatient Prescriptions Prior to Visit  Medication Sig Dispense Refill  . ALPRAZolam (XANAX) 0.25 MG tablet Take 0.25 mg by mouth 3 (three) times daily as needed for sleep.    . cetirizine (ZYRTEC) 10 MG tablet Take 10 mg by mouth daily.    . fluticasone (FLONASE) 50 MCG/ACT nasal spray Place 2 sprays into both nostrils daily. 16 g 1  . hyoscyamine (LEVSIN/SL) 0.125 MG SL tablet Take 1 tab on the tongue 2-3 times daily as needed for cramping , abdominal pain. 30 tablet 0  . venlafaxine (EFFEXOR) 75 MG tablet Take 75 mg by mouth daily.    Marland Kitchen diltiazem 2 % GEL Apply 1 application topically 3 (three) times daily. (Patient not taking: Reported on 06/17/2014) 30 g 0   No facility-administered medications prior to visit.    PE: Blood pressure 107/71, pulse 86, temperature 97.4 F (36.3 C), temperature source Temporal, height 5' 3.75" (1.619 m), weight 203 lb (92.08 kg), SpO2 98 %.  Pt examined with Quentin Ore, CMA, as chaperone. Gen: Alert, well appearing.  Patient is oriented to person, place, time, and situation. Pt can walk on heels for a few steps, can walk on balls of feet for a few steps. Flexion is significantly impaired in L spine (10 deg).  Extension is nearly intact but causes signif pain. Rotation and lateral bending at L spine is intact w/out pain. Sitting SLR neg bilat.  LE strength 5/5 prox and dist bilat.  Patellar and  achilles DTRs 2+/symmetric. Mild TTP in L4-L5 midline and facet joint region bilat.  No bruising or back deformity.   IMPRESSION AND PLAN:  Acute low back strain, initial encounter. Ibuprofen 600 mg bid with food x 10d. Vicodin 5/325, 1-2 q6h prn, #30.  Therapeutic expectations and side effect profile of medication discussed today.  Patient's questions answered. Ice x 20 min bid for the next 2-3 days. Start LB stretches in 2 days--handout given to patient.  An After Visit Summary was printed and given to the patient.  FOLLOW UP: prn

## 2014-06-17 NOTE — Patient Instructions (Signed)
Take 600 mg ibuprofen twice a day for 10d.  Apply ice to the area for 20 min two times per day for the next few days.    Start low back stretches on Wednesday 06/19/14.Back Exercises These exercises may help you when beginning to rehabilitate your injury. Your symptoms may resolve with or without further involvement from your physician, physical therapist or athletic trainer. While completing these exercises, remember:   Restoring tissue flexibility helps normal motion to return to the joints. This allows healthier, less painful movement and activity.  An effective stretch should be held for at least 30 seconds.  A stretch should never be painful. You should only feel a gentle lengthening or release in the stretched tissue. STRETCH - Extension, Prone on Elbows   Lie on your stomach on the floor, a bed will be too soft. Place your palms about shoulder width apart and at the height of your head.  Place your elbows under your shoulders. If this is too painful, stack pillows under your chest.  Allow your body to relax so that your hips drop lower and make contact more completely with the floor.  Hold this position for __________ seconds.  Slowly return to lying flat on the floor. Repeat __________ times. Complete this exercise __________ times per day.  RANGE OF MOTION - Extension, Prone Press Ups   Lie on your stomach on the floor, a bed will be too soft. Place your palms about shoulder width apart and at the height of your head.  Keeping your back as relaxed as possible, slowly straighten your elbows while keeping your hips on the floor. You may adjust the placement of your hands to maximize your comfort. As you gain motion, your hands will come more underneath your shoulders.  Hold this position __________ seconds.  Slowly return to lying flat on the floor. Repeat __________ times. Complete this exercise __________ times per day.  RANGE OF MOTION- Quadruped, Neutral Spine   Assume  a hands and knees position on a firm surface. Keep your hands under your shoulders and your knees under your hips. You may place padding under your knees for comfort.  Drop your head and point your tail bone toward the ground below you. This will round out your low back like an angry cat. Hold this position for __________ seconds.  Slowly lift your head and release your tail bone so that your back sags into a large arch, like an old horse.  Hold this position for __________ seconds.  Repeat this until you feel limber in your low back.  Now, find your "sweet spot." This will be the most comfortable position somewhere between the two previous positions. This is your neutral spine. Once you have found this position, tense your stomach muscles to support your low back.  Hold this position for __________ seconds. Repeat __________ times. Complete this exercise __________ times per day.  STRETCH - Flexion, Single Knee to Chest   Lie on a firm bed or floor with both legs extended in front of you.  Keeping one leg in contact with the floor, bring your opposite knee to your chest. Hold your leg in place by either grabbing behind your thigh or at your knee.  Pull until you feel a gentle stretch in your low back. Hold __________ seconds.  Slowly release your grasp and repeat the exercise with the opposite side. Repeat __________ times. Complete this exercise __________ times per day.  STRETCH - Hamstrings, Standing  Stand or sit  and extend your right / left leg, placing your foot on a chair or foot stool  Keeping a slight arch in your low back and your hips straight forward.  Lead with your chest and lean forward at the waist until you feel a gentle stretch in the back of your right / left knee or thigh. (When done correctly, this exercise requires leaning only a small distance.)  Hold this position for __________ seconds. Repeat __________ times. Complete this stretch __________ times per  day. STRENGTHENING - Deep Abdominals, Pelvic Tilt   Lie on a firm bed or floor. Keeping your legs in front of you, bend your knees so they are both pointed toward the ceiling and your feet are flat on the floor.  Tense your lower abdominal muscles to press your low back into the floor. This motion will rotate your pelvis so that your tail bone is scooping upwards rather than pointing at your feet or into the floor.  With a gentle tension and even breathing, hold this position for __________ seconds. Repeat __________ times. Complete this exercise __________ times per day.  STRENGTHENING - Abdominals, Crunches   Lie on a firm bed or floor. Keeping your legs in front of you, bend your knees so they are both pointed toward the ceiling and your feet are flat on the floor. Cross your arms over your chest.  Slightly tip your chin down without bending your neck.  Tense your abdominals and slowly lift your trunk high enough to just clear your shoulder blades. Lifting higher can put excessive stress on the low back and does not further strengthen your abdominal muscles.  Control your return to the starting position. Repeat __________ times. Complete this exercise __________ times per day.  STRENGTHENING - Quadruped, Opposite UE/LE Lift   Assume a hands and knees position on a firm surface. Keep your hands under your shoulders and your knees under your hips. You may place padding under your knees for comfort.  Find your neutral spine and gently tense your abdominal muscles so that you can maintain this position. Your shoulders and hips should form a rectangle that is parallel with the floor and is not twisted.  Keeping your trunk steady, lift your right hand no higher than your shoulder and then your left leg no higher than your hip. Make sure you are not holding your breath. Hold this position __________ seconds.  Continuing to keep your abdominal muscles tense and your back steady, slowly return  to your starting position. Repeat with the opposite arm and leg. Repeat __________ times. Complete this exercise __________ times per day. Document Released: 07/09/2005 Document Revised: 09/13/2011 Document Reviewed: 10/03/2008 [facility] Patient Information 2015 [place], [place]. This information is not intended to replace advice given to you by your health care provider. Make sure you discuss any questions you have with your health care provider.

## 2014-06-17 NOTE — Progress Notes (Signed)
Pre visit review using our clinic review tool, if applicable. No additional management support is needed unless otherwise documented below in the visit note. 

## 2014-07-16 ENCOUNTER — Encounter: Payer: Self-pay | Admitting: Family Medicine

## 2014-07-16 ENCOUNTER — Ambulatory Visit (INDEPENDENT_AMBULATORY_CARE_PROVIDER_SITE_OTHER): Payer: 59 | Admitting: Family Medicine

## 2014-07-16 VITALS — BP 120/82 | HR 88 | Temp 98.7°F | Ht 63.75 in | Wt 207.2 lb

## 2014-07-16 DIAGNOSIS — J069 Acute upper respiratory infection, unspecified: Secondary | ICD-10-CM

## 2014-07-16 MED ORDER — HYDROCODONE-HOMATROPINE 5-1.5 MG/5ML PO SYRP: 5.0000 mL | ORAL_SOLUTION | Freq: Three times a day (TID) | ORAL | Status: DC | PRN

## 2014-07-16 MED ORDER — ALBUTEROL SULFATE HFA 108 (90 BASE) MCG/ACT IN AERS: 2.0000 | INHALATION_SPRAY | Freq: Four times a day (QID) | RESPIRATORY_TRACT | Status: DC | PRN

## 2014-07-16 NOTE — Progress Notes (Signed)
HPI:  URI: -started: 3 days ago -symptoms:started with scratchy throat, nasal congestion, sore throat, cough, mild SOB -denies:fever, SOB, NVD, sinus pain -has tried: dayquil, nyquil -sick contacts/travel/risks: denies flu exposure, tick exposure or or Ebola risks, her kids have been sick too -reports always gets bronchitis with URI and uses alb for this from time to time -Hx of: allergies  ROS: See pertinent positives and negatives per HPI.  Past Medical History  Diagnosis Date  . Palpitations   . Anxiety   . Allergic rhinitis     Past Surgical History  Procedure Laterality Date  . Tonsillectomy    . Cesarean section      x2    Family History  Problem Relation Age of Onset  . Cancer - Other Father     Throat  . CAD Neg Hx   . Hypertension Mother   . Hyperlipidemia Mother   . Mental illness Mother   . Celiac disease Mother   . Diabetes Paternal Grandfather     History   Social History  . Marital Status: Married    Spouse Name: N/A    Number of Children: N/A  . 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 Smoking status: Current Every Day Smoker -- 0.20 packs/day for 15 years    Types: Cigarettes  . Smokeless tobacco: Never Used  . Alcohol Use: 6.0 oz/week    12 drink(s) per week  . Drug Use: No  . Sexual Activity: None   Other Topics Concern  . None   Social History Narrative   Work or School: [employer]      Home Situation: lives with husband and two children      Spiritual Beliefs: none      Lifestyle: no regular exercise; diet is terrible             Current outpatient prescriptions: ALPRAZolam (XANAX) 0.25 MG tablet, Take 0.25 mg by mouth 3 (three) times daily as needed for sleep., Disp: , Rfl: ;  cetirizine (ZYRTEC) 10 MG tablet, Take 10 mg by mouth daily., Disp: , Rfl: ;  fluticasone (FLONASE) 50 MCG/ACT nasal spray, Place 2 sprays into both nostrils daily., Disp:  16 g, Rfl: 1;  venlafaxine (EFFEXOR) 75 MG tablet, Take 75 mg by mouth daily., Disp: , Rfl:  albuterol (PROVENTIL HFA;VENTOLIN HFA) 108 (90 BASE) MCG/ACT inhaler, Inhale 2 puffs into the lungs every 6 (six) hours as needed., Disp: 1 Inhaler, Rfl: 0;  HYDROcodone-homatropine (HYCODAN) 5-1.5 MG/5ML syrup, Take 5 mLs by mouth every 8 (eight) hours as needed for cough., Disp: 120 mL, Rfl: 0  EXAM:  Filed Vitals:   07/16/14 1549  BP: 120/82  Pulse: 88  Temp: 98.7 F (37.1 C)    Body mass index is 35.86 kg/(m^2).  GENERAL: vitals reviewed and listed above, alert, oriented, appears well hydrated and in no acute distress  HEENT: atraumatic, conjunttiva clear, no obvious abnormalities on inspection of external nose and ears, normal appearance of ear canals and TMs, clear nasal congestion, mild post oropharyngeal erythema with PND, no tonsillar edema or exudate, no sinus TTP  NECK: no obvious masses on inspection  LUNGS: clear to auscultation bilaterally, no wheezes, rales or rhonchi, good air movement  CV: HRRR, no peripheral edema  MS: moves all extremities without noticeable abnormality  PSYCH: pleasant and cooperative, no obvious depression or anxiety  ASSESSMENT AND PLAN:  Discussed the  following assessment and plan:  Acute upper respiratory infection - Plan: albuterol (PROVENTIL HFA;VENTOLIN HFA) 108 (90 BASE) MCG/ACT inhaler, HYDROcodone-homatropine (HYCODAN) 5-1.5 MG/5ML syrup  -given HPI and exam findings today, a serious infection or illness is unlikely. We discussed potential etiologies, with VURI being most likely, and advised supportive care and monitoring. We discussed treatment side effects, likely course, antibiotic misuse, transmission, and signs of developing a serious illness. -of course, we advised to return or notify a doctor immediately if symptoms worsen or persist or new concerns arise.    Patient Instructions  INSTRUCTIONS FOR UPPER RESPIRATORY  INFECTION:  -plenty of rest and fluids  -quit smoking - set up appointment to help more with this  -nasal saline wash 2-3 times daily (use prepackaged nasal saline or bottled/distilled water if making your own)   -can use AFRIN nasal spray for drainage and nasal congestion - but do NOT use longer then 3-4 days  -can use tylenol or ibuprofen as directed for aches and sorethroat  -in the winter time, using a humidifier at night is helpful (please follow cleaning instructions)  -if you are taking a cough medication - use only as directed, may also try a teaspoon of honey to coat the throat and throat lozenges  -for sore throat, salt water gargles can help  -follow up if you have fevers, facial pain, tooth pain, difficulty breathing or are worsening or not getting better in 5-7 days      Tamarick Kovalcik R.

## 2014-07-16 NOTE — Patient Instructions (Addendum)
INSTRUCTIONS FOR UPPER RESPIRATORY INFECTION:  -plenty of rest and fluids  -quit smoking - set up appointment to help more with this  -nasal saline wash 2-3 times daily (use prepackaged nasal saline or bottled/distilled water if making your own)   -can use AFRIN nasal spray for drainage and nasal congestion - but do NOT use longer then 3-4 days  -can use tylenol or ibuprofen as directed for aches and sorethroat  -in the winter time, using a humidifier at night is helpful (please follow cleaning instructions)  -if you are taking a cough medication - use only as directed, may also try a teaspoon of honey to coat the throat and throat lozenges  -for sore throat, salt water gargles can help  -follow up if you have fevers, facial pain, tooth pain, difficulty breathing or are worsening or not getting better in 5-7 days

## 2014-07-16 NOTE — Progress Notes (Signed)
Pre visit review using our clinic review tool, if applicable. No additional management support is needed unless otherwise documented below in the visit note. 

## 2014-10-22 ENCOUNTER — Encounter: Payer: Self-pay | Admitting: Family Medicine

## 2014-10-22 ENCOUNTER — Ambulatory Visit (INDEPENDENT_AMBULATORY_CARE_PROVIDER_SITE_OTHER): Payer: 59 | Admitting: Family Medicine

## 2014-10-22 VITALS — BP 128/90 | HR 102 | Temp 98.3°F | Ht 63.75 in | Wt 208.6 lb

## 2014-10-22 DIAGNOSIS — Z6836 Body mass index (BMI) 36.0-36.9, adult: Secondary | ICD-10-CM | POA: Insufficient documentation

## 2014-10-22 DIAGNOSIS — Z720 Tobacco use: Secondary | ICD-10-CM

## 2014-10-22 DIAGNOSIS — E782 Mixed hyperlipidemia: Secondary | ICD-10-CM | POA: Diagnosis not present

## 2014-10-22 DIAGNOSIS — IMO0001 Reserved for inherently not codable concepts without codable children: Secondary | ICD-10-CM | POA: Insufficient documentation

## 2014-10-22 DIAGNOSIS — R030 Elevated blood-pressure reading, without diagnosis of hypertension: Secondary | ICD-10-CM | POA: Diagnosis not present

## 2014-10-22 NOTE — Progress Notes (Addendum)
HPI:  Acute visit for:  1-2) Elevated triglycerides/obesity: -reports had blood check with gyn (gyn) and told trig elevated -mildly elevated at check in September - but not in range where medication needed and diet and exercise was advised -diet and exercise: join a gym last week and doing 1 hour of exercise about 3 days per week; she is trying to work on diet -current diet: peanut butter crackers; cheeseburger, steak and cheese sub, pizza, sodas, sweetened beverages -no hx pancreatitis, CAD, DM  Addendum: received labs from ob/gyn office after her visit. Tchol 158, Trig 206, HDL 24, LDL 93.  3)Tobacco abuse -smokes about 10-12 cigs per day -wants to quit but feels will power is low -concerned for interactions with chantix/wellbutrin with her psych meds -no hx COPD, hemoptysis  ROS: See pertinent positives and negatives per HPI.  Past Medical History  Diagnosis Date  . Palpitations   . Anxiety   . Allergic rhinitis   . Tobacco abuse 09/20/2012  . Elevated cholesterol with high triglycerides 10/22/2014  . Elevated blood pressure 10/22/2014    Past Surgical History  Procedure Laterality Date  . Tonsillectomy    . Cesarean section      x2    Family History  Problem Relation Age of Onset  . Cancer - Other Father     Throat  . CAD Neg Hx   . Hypertension Mother   . Hyperlipidemia Mother   . Mental illness Mother   . Celiac disease Mother   . Diabetes Paternal Grandfather     History   Social History  . Marital Status: Married    Spouse Name: N/A  . Number of Children: N/A  . 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 Smoking status: Current Every Day Smoker -- 0.20 packs/day for 15 years    Types: Cigarettes  . Smokeless tobacco: Never Used  . Alcohol Use: 6.0 oz/week    12 drink(s) per week  . Drug Use: No  . Sexual Activity: Not on file   Other Topics Concern  . None   Social  History Narrative   Work or School: [employer]      Home Situation: lives with husband and two children      Spiritual Beliefs: none      Lifestyle: no regular exercise; diet is terrible              Current outpatient prescriptions:  .  ALPRAZolam (XANAX) 0.25 MG tablet, Take 0.25 mg by mouth 3 (three) times daily as needed for sleep., Disp: , Rfl:  .  cetirizine (ZYRTEC) 10 MG tablet, Take 10 mg by mouth daily., Disp: , Rfl:  .  Cholecalciferol (VITAMIN D PO), Take 1,200 mg by mouth daily., Disp: , Rfl:  .  fluticasone (FLONASE) 50 MCG/ACT nasal spray, Place 2 sprays into both nostrils daily., Disp: 16 g, Rfl: 1 .  venlafaxine (EFFEXOR) 75 MG tablet, Take 75 mg by mouth daily., Disp: , Rfl:   EXAM:  Filed Vitals:   10/22/14 0918  BP: 128/90  Pulse: 102  Temp: 98.3 F (36.8 C)    Body mass index is 36.1 kg/(m^2).  GENERAL: vitals reviewed and listed above, alert, oriented, appears well hydrated and in no acute distress  HEENT: atraumatic, conjunttiva clear, no obvious abnormalities on inspection of external nose and ears  NECK: no obvious masses on inspection  LUNGS: clear to auscultation  bilaterally, no wheezes, rales or rhonchi, good air movement  CV: HRRR, no peripheral edema  MS: moves all extremities without noticeable abnormality  PSYCH: pleasant and cooperative, no obvious depression or anxiety  ASSESSMENT AND PLAN:  Discussed the following assessment and plan:  Elevated cholesterol with high triglycerides -FASTING labs next visit - lifestyle changes with healthy diet with healthy fats for HDL and regular CV exercise  Tobacco abuse -counseled 3-5 minutes, opted for slowly cutting back then using quit coach and nicotine replacement temporarily if needed for cravings Brochure given  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  Patient Instructions  BEFORE YOU LEAVE: -schedule follow up in 3 months - come fasting  We recommend the following healthy lifestyle measures: - eat a healthy diet consisting of lots of vegetables, fruits, beans, nuts, seeds, healthy meats such as white chicken and fish -  - avoid fried foods, fast food, processed foods, sodas, red meet and other fattening foods.  - get a least 150 minutes of aerobic exercise per week.   Cut back by 2 cigarettes per week then when down to 4-6 per day quit and use nicotine replacement            KIM, HANNAH R.

## 2014-10-22 NOTE — Patient Instructions (Addendum)
BEFORE YOU LEAVE: -schedule follow up in 3 months - come fasting  We recommend the following healthy lifestyle measures: - eat a healthy diet consisting of lots of vegetables, fruits, beans, nuts, seeds, healthy meats such as white chicken and fish -  - avoid fried foods, fast food, processed foods, sodas, red meet and other fattening foods.  - get a least 150 minutes of aerobic exercise per week.   Cut back by 2 cigarettes per week then when down to 4-6 per day quit and use nicotine replacement

## 2014-10-22 NOTE — Addendum Note (Signed)
Addended by: [name] on: 10/22/2014 09:44 AM   Modules accepted: Level of Service

## 2014-10-22 NOTE — Progress Notes (Signed)
Pre visit review using our clinic review tool, if applicable. No additional management support is needed unless otherwise documented below in the visit note. 

## 2014-11-08 ENCOUNTER — Encounter: Payer: Self-pay | Admitting: Family Medicine

## 2015-01-22 ENCOUNTER — Ambulatory Visit (INDEPENDENT_AMBULATORY_CARE_PROVIDER_SITE_OTHER): Payer: Commercial Managed Care - HMO | Admitting: Family Medicine

## 2015-01-22 DIAGNOSIS — R69 Illness, unspecified: Secondary | ICD-10-CM

## 2015-01-22 NOTE — Progress Notes (Signed)
NO SHOW FOR FOLLOW UP  On ROC prior to appointment:  -2) Elevated triglycerides/obesity: -mildly elevated trig done with ob, labs scanned in -diet and exercise: joined a gym in 4/16 and doing 1 hour of exercise about 3 days per week; she was trying to work on diet -diet last visit:peanut butter crackers; cheeseburger, steak and cheese sub, pizza, sodas, sweetened beverages -no hx pancreatitis, CAD, DM -labs from ob/gyn 10/2014 Tchol 158, Trig 206, HDL 24, LDL 93.  3)Tobacco abuse -smokes about 10-12 cigs per day -wants to quit but feels will power is low -concerned for interactions with chantix/wellbutrin with her psych meds -no hx COPD, hemoptysis  4) Anxiety: -sees psych -meds: effexor - xanax prn

## 2015-02-10 ENCOUNTER — Other Ambulatory Visit: Payer: Self-pay | Admitting: Nurse Practitioner

## 2015-02-14 ENCOUNTER — Ambulatory Visit (INDEPENDENT_AMBULATORY_CARE_PROVIDER_SITE_OTHER): Payer: Commercial Managed Care - HMO | Admitting: Family Medicine

## 2015-02-14 ENCOUNTER — Encounter: Payer: Self-pay | Admitting: Family Medicine

## 2015-02-14 DIAGNOSIS — Z720 Tobacco use: Secondary | ICD-10-CM | POA: Diagnosis not present

## 2015-02-14 DIAGNOSIS — Z6836 Body mass index (BMI) 36.0-36.9, adult: Secondary | ICD-10-CM | POA: Diagnosis not present

## 2015-02-14 DIAGNOSIS — E782 Mixed hyperlipidemia: Secondary | ICD-10-CM | POA: Diagnosis not present

## 2015-02-14 LAB — LIPID PANEL
CHOL/HDL RATIO: 6
Cholesterol: 170 mg/dL (ref 0–200)
HDL: 30.5 mg/dL — AB (ref >39.00)
NONHDL: 139.46
TRIGLYCERIDES: 204 mg/dL — AB (ref 0.0–149.0)
VLDL: 40.8 mg/dL — AB (ref 0.0–40.0)

## 2015-02-14 LAB — LDL CHOLESTEROL, DIRECT: Direct LDL: 113 mg/dL

## 2015-02-14 NOTE — Progress Notes (Signed)
HPI:  1-2) Elevated triglycerides/obesity: -reports had blood check with gyn (gyn) and told trig elevated -mildly elevated at check in September - but not in range where medication needed and diet and exercise was advised -diet and exercise: joined a gym 1 hour of exercise about 3 days per week; she is trying to work on diet -current diet: peanut butter crackers; cheeseburger, steak and cheese sub, pizza, sodas, sweetened beverages - not much improvement here -no hx pancreatitis, CAD, DM  Addendum: received labs from ob/gyn office after her visit. Tchol 158, Trig 206, HDL 24, LDL 93.  3)Tobacco abuse -smokes about 10-12 cigs per day -wants to quit but feels will power is low -concerned for interactions with chantix/wellbutrin with her psych meds -no hx COPD, hemoptysis  Anxiety and Depression: -sees her gyn doc for this, but interested in seeing a psychiatrist -reports has issues with anxiety but does not like that medications make her tired   ROS: See pertinent positives and negatives per HPI.  Past Medical History  Diagnosis Date  . Palpitations   . Anxiety   . Allergic rhinitis   . Tobacco abuse 09/20/2012  . Elevated cholesterol with high triglycerides 10/22/2014  . Elevated blood pressure 10/22/2014  . Obesity     Past Surgical History  Procedure Laterality Date  . Tonsillectomy    . Cesarean section      x2    Family History  Problem Relation Age of Onset  . Cancer - Other Father     Throat  . CAD Neg Hx   . Hypertension Mother   . Hyperlipidemia Mother   . Mental illness Mother   . Celiac disease Mother   . Diabetes Paternal Grandfather     Social History   Social History  . Marital Status: Married    Spouse Name: N/A  . Number of Children: N/A  . 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 Smoking status: Current Every Day Smoker -- 0.20 packs/day for 15 years    Types:  Cigarettes  . Smokeless tobacco: Never Used  . Alcohol Use: 6.0 oz/week    12 drink(s) per week  . Drug Use: No  . Sexual Activity: Not Asked   Other Topics Concern  . None   Social History Narrative   Work or School: [employer]      Home Situation: lives with husband and two children      Spiritual Beliefs: none      Lifestyle: no regular exercise; diet is terrible              Current outpatient prescriptions:  .  ALPRAZolam (XANAX) 0.25 MG tablet, Take 0.25 mg by mouth 3 (three) times daily as needed for sleep., Disp: , Rfl:  .  cetirizine (ZYRTEC) 10 MG tablet, Take 10 mg by mouth daily., Disp: , Rfl:  .  Cholecalciferol (VITAMIN D PO), Take 1,200 mg by mouth daily., Disp: , Rfl:  .  fluticasone (FLONASE) 50 MCG/ACT nasal spray, Place 2 sprays into both nostrils daily., Disp: 16 g, Rfl: 1 .  venlafaxine (EFFEXOR) 75 MG tablet, Take 75 mg by mouth daily., Disp: , Rfl:   EXAM:  Filed Vitals:   02/14/15 0859  BP: 110/70  Pulse: 88  Temp: 98.7 F (37.1 C)    Body mass index is 35.13 kg/(m^2).  GENERAL: vitals reviewed and listed above, alert, oriented, appears well hydrated  and in no acute distress  HEENT: atraumatic, conjunttiva clear, no obvious abnormalities on inspection of external nose and ears  NECK: no obvious masses on inspection  LUNGS: clear to auscultation bilaterally, no wheezes, rales or rhonchi, good air movement  CV: HRRR, no peripheral edema  MS: moves all extremities without noticeable abnormality  PSYCH: pleasant and cooperative, no obvious depression or anxiety  ASSESSMENT AND PLAN:  Discussed the following assessment and plan:  Tobacco abuse  Elevated cholesterol with high triglycerides - Plan: Lipid panel  BMI 36.0-36.9,adult  -lifestyle recs -advised to quit smoking and counseled breifly -discussed treatment of GAD, provided information for several psychiatry offices and she plans to call for appointment -Patient advised  to return or notify a doctor immediately if symptoms worsen or persist or new concerns arise.  There are no Patient Instructions on file for this visit.   Colin Benton R.

## 2015-02-14 NOTE — Progress Notes (Signed)
Pre visit review using our clinic review tool, if applicable. No additional management support is needed unless otherwise documented below in the visit note. 

## 2015-02-24 ENCOUNTER — Encounter: Payer: Self-pay | Admitting: *Deleted

## 2015-02-24 ENCOUNTER — Encounter: Payer: Self-pay | Admitting: Family Medicine

## 2015-02-24 ENCOUNTER — Ambulatory Visit (INDEPENDENT_AMBULATORY_CARE_PROVIDER_SITE_OTHER): Payer: Commercial Managed Care - HMO | Admitting: Family Medicine

## 2015-02-24 VITALS — BP 118/80 | HR 111 | Temp 99.2°F | Ht 63.75 in | Wt 203.6 lb

## 2015-02-24 DIAGNOSIS — B349 Viral infection, unspecified: Secondary | ICD-10-CM | POA: Diagnosis not present

## 2015-02-24 NOTE — Progress Notes (Signed)
Pre visit review using our clinic review tool, if applicable. No additional management support is needed unless otherwise documented below in the visit note. 

## 2015-02-24 NOTE — Progress Notes (Signed)
HPI:  Sore throat: -started: today -symptoms: sore throat, low grade temp -denies:  SOB, NVD, tooth pain -has tried: none -sick contacts/travel/risks: denies flu exposure, tick exposure or or Ebola risks -Hx of: allergies  ROS: See pertinent positives and negatives per HPI.  Past Medical History  Diagnosis Date  . Palpitations   . Anxiety   . Allergic rhinitis   . Tobacco abuse 09/20/2012  . Elevated cholesterol with high triglycerides 10/22/2014  . Elevated blood pressure 10/22/2014  . Obesity     Past Surgical History  Procedure Laterality Date  . Tonsillectomy    . Cesarean section      x2    Family History  Problem Relation Age of Onset  . Cancer - Other Father     Throat  . CAD Neg Hx   . Hypertension Mother   . Hyperlipidemia Mother   . Mental illness Mother   . Celiac disease Mother   . Diabetes Paternal Grandfather     Social History   Social History  . Marital Status: Married    Spouse Name: N/A  . Number of Children: N/A  . 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 Smoking status: Current Every Day Smoker -- 0.20 packs/day for 15 years    Types: Cigarettes  . Smokeless tobacco: Never Used  . Alcohol Use: 6.0 oz/week    12 drink(s) per week  . Drug Use: No  . Sexual Activity: Not Asked   Other Topics Concern  . None   Social History Narrative   Work or School: [employer]      Home Situation: lives with husband and two children      Spiritual Beliefs: none      Lifestyle: no regular exercise; diet is terrible              Current outpatient prescriptions:  .  ALPRAZolam (XANAX) 0.25 MG tablet, Take 0.25 mg by mouth 3 (three) times daily as needed for sleep., Disp: , Rfl:  .  cetirizine (ZYRTEC) 10 MG tablet, Take 10 mg by mouth daily., Disp: , Rfl:  .  Cholecalciferol (VITAMIN D PO), Take 1,200 mg by mouth daily., Disp: , Rfl:  .  fluticasone (FLONASE)  50 MCG/ACT nasal spray, Place 2 sprays into both nostrils daily., Disp: 16 g, Rfl: 1 .  venlafaxine (EFFEXOR) 75 MG tablet, Take 75 mg by mouth daily., Disp: , Rfl:   EXAM:  Filed Vitals:   02/24/15 1603  BP: 118/80  Pulse: 111  Temp: 99.2 F (37.3 C)    Body mass index is 35.23 kg/(m^2).  GENERAL: vitals reviewed and listed above, alert, oriented, appears well hydrated and in no acute distress  HEENT: atraumatic, conjunttiva clear, no obvious abnormalities on inspection of external nose and ears, normal appearance of ear canals and TMs, clear nasal congestion, mild post oropharyngeal erythema with PND and a few small ulcers soft palate, no tonsillar edema or exudate, no sinus TTP  NECK: no obvious masses on inspection  LUNGS: clear to auscultation bilaterally, no wheezes, rales or rhonchi, good air movement  CV: HRRR, no peripheral edema  MS: moves all extremities without noticeable abnormality  SKIN: no lesions on mouth, hands or feet  PSYCH: pleasant and cooperative, no obvious depression or anxiety  ASSESSMENT AND PLAN:  Discussed the following assessment and plan:  Viral infection  -given HPI and exam findings today,  a serious infection or illness is unlikely. We discussed potential etiologies, with VURI being most likely, and advised supportive care and monitoring. We discussed treatment side effects, likely course, antibiotic misuse, transmission, and signs of deveoping a serious illness. -of course, we advised to return or notify a doctor immediately if symptoms worsen or persist or new concerns arise.    Patient Instructions  Viral Infections A viral infection can be caused by different types of viruses.Most viral infections are not serious and resolve on their own. However, some infections may cause severe symptoms and may lead to further complications. SYMPTOMS Viruses can frequently cause:  Sore throat.  Aches and pains.  Headaches.  Runny  nose.  Different types of rashes.  Watery eyes.  Tiredness.  Cough.  Loss of appetite.  Gastrointestinal infections, resulting in nausea, vomiting, and diarrhea. These symptoms do not respond to antibiotics because the infection is not caused by bacteria. However, you might catch a bacterial infection following the viral infection. This is sometimes called a "superinfection." Symptoms of such a bacterial infection may include:  Worsening sore throat with pus and difficulty swallowing.  Swollen neck glands.  Chills and a high or persistent fever.  Severe headache.  Tenderness over the sinuses.  Persistent overall ill feeling (malaise), muscle aches, and tiredness (fatigue).  Persistent cough.  Yellow, green, or brown mucus production with coughing. HOME CARE INSTRUCTIONS   Only take over-the-counter or prescription medicines for pain, discomfort, diarrhea, or fever as directed by your caregiver.  Drink enough water and fluids to keep your urine clear or pale yellow. Sports drinks can provide valuable electrolytes, sugars, and hydration.  Get plenty of rest and maintain proper nutrition. Soups and broths with crackers or rice are fine. SEEK IMMEDIATE MEDICAL CARE IF:   You have severe headaches, shortness of breath, chest pain, neck pain, or an unusual rash.  You have uncontrolled vomiting, diarrhea, or you are unable to keep down fluids.  You or your child has an oral temperature above 102 F (38.9 C), not controlled by medicine.  Your baby is older than 3 months with a rectal temperature of 102 F (38.9 C) or higher.  Your baby is 27 months old or younger with a rectal temperature of 100.4 F (38 C) or higher. MAKE SURE YOU:   Understand these instructions.  Will watch your condition.  Will get help right away if you are not doing well or get worse. Document Released: 03/31/2005 Document Revised: 09/13/2011 Document Reviewed: 10/26/2010 [facility] Patient  Information 2015 [place], [place]. This information is not intended to replace advice given to you by your health care provider. Make sure you discuss any questions you have with your health care provider.      Colin Benton R.

## 2015-02-24 NOTE — Patient Instructions (Signed)
Viral Infections A viral infection can be caused by different types of viruses.Most viral infections are not serious and resolve on their own. However, some infections may cause severe symptoms and may lead to further complications. SYMPTOMS Viruses can frequently cause:  Sore throat.  Aches and pains.  Headaches.  Runny nose.  Different types of rashes.  Watery eyes.  Tiredness.  Cough.  Loss of appetite.  Gastrointestinal infections, resulting in nausea, vomiting, and diarrhea. These symptoms do not respond to antibiotics because the infection is not caused by bacteria. However, you might catch a bacterial infection following the viral infection. This is sometimes called a "superinfection." Symptoms of such a bacterial infection may include:  Worsening sore throat with pus and difficulty swallowing.  Swollen neck glands.  Chills and a high or persistent fever.  Severe headache.  Tenderness over the sinuses.  Persistent overall ill feeling (malaise), muscle aches, and tiredness (fatigue).  Persistent cough.  Yellow, green, or brown mucus production with coughing. HOME CARE INSTRUCTIONS   Only take over-the-counter or prescription medicines for pain, discomfort, diarrhea, or fever as directed by your caregiver.  Drink enough water and fluids to keep your urine clear or pale yellow. Sports drinks can provide valuable electrolytes, sugars, and hydration.  Get plenty of rest and maintain proper nutrition. Soups and broths with crackers or rice are fine. SEEK IMMEDIATE MEDICAL CARE IF:   You have severe headaches, shortness of breath, chest pain, neck pain, or an unusual rash.  You have uncontrolled vomiting, diarrhea, or you are unable to keep down fluids.  You or your child has an oral temperature above 102 F (38.9 C), not controlled by medicine.  Your baby is older than 3 months with a rectal temperature of 102 F (38.9 C) or higher.  Your baby is 67 months  old or younger with a rectal temperature of 100.4 F (38 C) or higher. MAKE SURE YOU:   Understand these instructions.  Will watch your condition.  Will get help right away if you are not doing well or get worse. Document Released: 03/31/2005 Document Revised: 09/13/2011 Document Reviewed: 10/26/2010 [facility] Patient Information 2015 [place], [place]. This information is not intended to replace advice given to you by your health care provider. Make sure you discuss any questions you have with your health care provider.

## 2015-04-22 ENCOUNTER — Telehealth: Payer: Self-pay | Admitting: Family Medicine

## 2015-04-22 NOTE — Telephone Encounter (Signed)
I called the pt and scheduled an appt for Thursday at 9:45.

## 2015-04-22 NOTE — Telephone Encounter (Signed)
Can you help her get appt. Thanks.

## 2015-04-22 NOTE — Telephone Encounter (Signed)
Pt said she is having some bad digestive issues and she contacted GI and they can not see her till Dec. She asked if Dr Maudie Mercury had any suggestion.

## 2015-04-24 ENCOUNTER — Ambulatory Visit (INDEPENDENT_AMBULATORY_CARE_PROVIDER_SITE_OTHER): Payer: Commercial Managed Care - HMO | Admitting: Family Medicine

## 2015-04-24 ENCOUNTER — Encounter: Payer: Self-pay | Admitting: Family Medicine

## 2015-04-24 VITALS — BP 100/74 | HR 87 | Temp 98.7°F | Ht 63.75 in | Wt 202.8 lb

## 2015-04-24 DIAGNOSIS — K589 Irritable bowel syndrome without diarrhea: Secondary | ICD-10-CM | POA: Diagnosis not present

## 2015-04-24 DIAGNOSIS — K529 Noninfective gastroenteritis and colitis, unspecified: Secondary | ICD-10-CM

## 2015-04-24 DIAGNOSIS — Z23 Encounter for immunization: Secondary | ICD-10-CM | POA: Diagnosis not present

## 2015-04-24 NOTE — Progress Notes (Signed)
Computer system down. See scanned paper documentation.  Colin Benton R.

## 2015-04-24 NOTE — Addendum Note (Signed)
Addended by: Agnes Lawrence on: 04/24/2015 01:37 PM   Modules accepted: Orders, Medications

## 2015-05-27 ENCOUNTER — Encounter: Payer: Self-pay | Admitting: Family Medicine

## 2015-05-27 ENCOUNTER — Ambulatory Visit (INDEPENDENT_AMBULATORY_CARE_PROVIDER_SITE_OTHER): Payer: Commercial Managed Care - HMO | Admitting: Family Medicine

## 2015-05-27 VITALS — BP 112/76 | HR 100 | Temp 99.1°F | Ht 63.75 in | Wt 204.6 lb

## 2015-05-27 DIAGNOSIS — B370 Candidal stomatitis: Secondary | ICD-10-CM | POA: Diagnosis not present

## 2015-05-27 DIAGNOSIS — J988 Other specified respiratory disorders: Secondary | ICD-10-CM

## 2015-05-27 MED ORDER — HYDROCODONE-HOMATROPINE 5-1.5 MG/5ML PO SYRP: 5.0000 mL | ORAL_SOLUTION | Freq: Every evening | ORAL | Status: DC | PRN

## 2015-05-27 MED ORDER — FLUCONAZOLE 150 MG PO TABS: 150.0000 mg | ORAL_TABLET | Freq: Once | ORAL | Status: DC

## 2015-05-27 MED ORDER — PREDNISONE 10 MG PO TABS: ORAL_TABLET | ORAL | Status: DC

## 2015-05-27 MED ORDER — AZITHROMYCIN 250 MG PO TABS: ORAL_TABLET | ORAL | Status: DC

## 2015-05-27 NOTE — Progress Notes (Addendum)
HPI:  Cough:  -started: 2 weeks ago -symptoms:nasal congestion, sore throat, cough, wheezing - worsening the last few days with thick sputum, white stuff on tongue, burning tongue -denies:fever, SOB, NVD, tooth pain, hemoptysis -has tried:  delsum -sick contacts/travel/risks: denies flu exposure, tick exposure or or Ebola risks -Hx of: allergies ROS: See pertinent positives and negatives per HPI.  Past Medical History  Diagnosis Date  . Palpitations   . Anxiety   . Allergic rhinitis   . Tobacco abuse 09/20/2012  . Elevated cholesterol with high triglycerides 10/22/2014  . Elevated blood pressure 10/22/2014  . Obesity     Past Surgical History  Procedure Laterality Date  . Tonsillectomy    . Cesarean section      x2    Family History  Problem Relation Age of Onset  . Cancer - Other Father     Throat  . CAD Neg Hx   . Hypertension Mother   . Hyperlipidemia Mother   . Mental illness Mother   . Celiac disease Mother   . Diabetes Paternal Grandfather     Social History   Social History  . Marital Status: Married    Spouse Name: N/A  . Number of Children: N/A  . 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 Smoking status: Current Every Day Smoker -- 0.20 packs/day for 15 years    Types: Cigarettes  . Smokeless tobacco: Never Used  . Alcohol Use: 6.0 oz/week    12 drink(s) per week  . Drug Use: No  . Sexual Activity: Not Asked   Other Topics Concern  . None   Social History Narrative   Work or School: [employer]      Home Situation: lives with husband and two children      Spiritual Beliefs: none      Lifestyle: no regular exercise; diet is terrible              Current outpatient prescriptions:  .  ALPRAZolam (XANAX) 0.5 MG tablet, TAKE 1 TABLET EVERY DAY AS NEEDED ANXEITY, Disp: , Rfl: 2 .  cetirizine (ZYRTEC) 10 MG tablet, Take 10 mg by mouth daily., Disp: , Rfl:  .   Cholecalciferol (VITAMIN D PO), Take 1,200 mg by mouth daily., Disp: , Rfl:  .  fluticasone (FLONASE) 50 MCG/ACT nasal spray, Place 2 sprays into both nostrils daily., Disp: 16 g, Rfl: 1 .  medroxyPROGESTERone (DEPO-PROVERA) 150 MG/ML injection, Inject 150 mg into the muscle every 3 (three) months., Disp: , Rfl:  .  venlafaxine (EFFEXOR) 75 MG tablet, Take 75 mg by mouth daily., Disp: , Rfl:  .  azithromycin (ZITHROMAX) 250 MG tablet, 2 tabs on day 1 then 1 tab daily, Disp: 6 tablet, Rfl: 0 .  fluconazole (DIFLUCAN) 150 MG tablet, Take 1 tablet (150 mg total) by mouth once., Disp: 3 tablet, Rfl: 0 .  HYDROcodone-homatropine (HYCODAN) 5-1.5 MG/5ML syrup, Take 5 mLs by mouth at bedtime as needed for cough (for cough, do not use with xanax or other sedating substances)., Disp: 120 mL, Rfl: 0 .  predniSONE (DELTASONE) 10 MG tablet, 40mg  daily for 2 days, then 30mg  daily for 2 days, then 20 mg daily for 2 days, then 10mg  daily for 2 days, Disp: 20 tablet, Rfl: 0  EXAM:  Filed Vitals:   05/27/15 1256  BP: 112/76  Pulse: 100  Temp: 99.1 F (37.3 C)  Body mass index is 35.41 kg/(m^2).  GENERAL: vitals reviewed and listed above, alert, oriented, appears well hydrated and in no acute distress  HEENT: atraumatic, conjunttiva clear, no obvious abnormalities on inspection of external nose and ears, normal appearance of ear canals and TMs, clear nasal congestion, mild post oropharyngeal erythema with PND, white plaque, removable on tongue, no tonsillar edema or exudate, no sinus TTP  NECK: no obvious masses on inspection  LUNGS: clear to auscultation bilaterally, no wheezes, rales or rhonchi, good air movement  CV: HRRR, no peripheral edema  MS: moves all extremities without noticeable abnormality  PSYCH: pleasant and cooperative, no obvious depression or anxiety  ASSESSMENT AND PLAN:  Discussed the following assessment and plan:  Respiratory infection  Thrush  -We discussed potential  etiologies, treatment side effects, likely course, antibiotic misuse, transmission, and signs of developing a serious illness. -opted for abx, steroid, diflucan for thrush, cough medication -of course, we advised to return or notify a doctor immediately if symptoms worsen or persist or new concerns arise.    Patient Instructions  INSTRUCTIONS FOR  RESPIRATORY INFECTION:  -Take the medications as prescribed  -plenty of rest and fluids  -nasal saline wash 2-3 times daily (use prepackaged nasal saline or bottled/distilled water if making your own)   -can use AFRIN nasal spray for drainage and nasal congestion - but do NOT use longer then 3-4 days  -can use tylenol (in no history of liver disease) or ibuprofen (if no history of kidney disease, bowel bleeding or significant heart disease) as directed for aches and sorethroat  -in the winter time, using a humidifier at night is helpful (please follow cleaning instructions)  -if you are taking a cough medication - use only as directed, may also try a teaspoon of honey to coat the throat and throat lozenges. If given a cough medication with codeine or hydrocodone or other narcotic please be advised that this contains a strong and  potentially addicting medication. Please follow instructions carefully, take as little as possible and only use AS NEEDED for severe cough. Discuss potential side effects with your pharmacy. Please do not drive or operate machinery while taking these types of medications. Please do not take other sedating medications, drugs or alcohol while taking this medication without discussing with your doctor.  -for sore throat, salt water gargles can help  -follow up if you have fevers, facial pain, tooth pain, difficulty breathing or are worsening or symptoms persist longer then expected      KIM, HANNAH R.

## 2015-05-27 NOTE — Patient Instructions (Addendum)
INSTRUCTIONS FOR  RESPIRATORY INFECTION:  -Take the medications as prescribed  -plenty of rest and fluids  -nasal saline wash 2-3 times daily (use prepackaged nasal saline or bottled/distilled water if making your own)   -can use AFRIN nasal spray for drainage and nasal congestion - but do NOT use longer then 3-4 days  -can use tylenol (in no history of liver disease) or ibuprofen (if no history of kidney disease, bowel bleeding or significant heart disease) as directed for aches and sorethroat  -in the winter time, using a humidifier at night is helpful (please follow cleaning instructions)  -if you are taking a cough medication - use only as directed, may also try a teaspoon of honey to coat the throat and throat lozenges. If given a cough medication with codeine or hydrocodone or other narcotic please be advised that this contains a strong and  potentially addicting medication. Please follow instructions carefully, take as little as possible and only use AS NEEDED for severe cough. Discuss potential side effects with your pharmacy. Please do not drive or operate machinery while taking these types of medications. Please do not take other sedating medications, drugs or alcohol while taking this medication without discussing with your doctor.  -for sore throat, salt water gargles can help  -follow up if you have fevers, facial pain, tooth pain, difficulty breathing or are worsening or symptoms persist longer then expected

## 2015-05-27 NOTE — Progress Notes (Signed)
Pre visit review using our clinic review tool, if applicable. No additional management support is needed unless otherwise documented below in the visit note. 

## 2015-06-06 ENCOUNTER — Ambulatory Visit (INDEPENDENT_AMBULATORY_CARE_PROVIDER_SITE_OTHER): Payer: Commercial Managed Care - HMO | Admitting: Family Medicine

## 2015-06-06 ENCOUNTER — Telehealth: Payer: Self-pay | Admitting: Family Medicine

## 2015-06-06 ENCOUNTER — Encounter: Payer: Self-pay | Admitting: Family Medicine

## 2015-06-06 VITALS — BP 120/70 | HR 101 | Temp 98.1°F | Wt 208.0 lb

## 2015-06-06 DIAGNOSIS — A499 Bacterial infection, unspecified: Secondary | ICD-10-CM | POA: Diagnosis not present

## 2015-06-06 DIAGNOSIS — J329 Chronic sinusitis, unspecified: Secondary | ICD-10-CM

## 2015-06-06 DIAGNOSIS — B9689 Other specified bacterial agents as the cause of diseases classified elsewhere: Secondary | ICD-10-CM

## 2015-06-06 MED ORDER — AMOXICILLIN-POT CLAVULANATE 875-125 MG PO TABS: 1.0000 | ORAL_TABLET | Freq: Two times a day (BID) | ORAL | Status: DC

## 2015-06-06 NOTE — Telephone Encounter (Signed)
[facility]  Patient Name: Diana James  DOB: Jul 29, 1980    Initial Comment Caller states prednisone and z-pak, not improving, still has cough and sinus pain   Nurse Assessment  Nurse: Wynetta Emery, RN, Baker Janus Date/Time (Eastern Time): 06/06/2015 9:35:04 AM  Confirm and document reason for call. If symptomatic, describe symptoms. ---[name] has completed the z pak and the prednisone for URI and bronchitis dx approximately 2 weeks ago still have cough and feels not getting better. is now having sinus pain with this  Has the patient traveled out of the country within the last 30 days? ---No  Does the patient have any new or worsening symptoms? ---Yes  Will a triage be completed? ---Yes  Related visit to physician within the last 2 weeks? ---Yes  Does the PT have any chronic conditions? (i.e. diabetes, asthma, etc.) ---No  Did the patient indicate they were pregnant? ---No  Is this a behavioral health or substance abuse call? ---No     Guidelines    Guideline Title Affirmed Question Affirmed Notes  Sinus Pain or Congestion [1] SEVERE pain AND [2] not improved 2 hours after pain medicine    Final Disposition User   See Physician within 4 Hours (or PCP triage) Wynetta Emery, RN, Baker Janus    Comments  NOTE; APPT WITH DR. Annie Main HUNTER 415PM 12--08-2014 NONE AVAIALBE WITH PCP   Referrals  REFERRED TO PCP OFFICE   Disagree/Comply: Comply

## 2015-06-06 NOTE — Telephone Encounter (Signed)
Patient has an appointment with Dr Yong Channel

## 2015-06-06 NOTE — Patient Instructions (Signed)
Bacterial sinusitis Treat with augmentin for 10 days If no better in 10 days return to care Hopefully start to turn corner within 24 hours though

## 2015-06-06 NOTE — Progress Notes (Signed)
PCP: [name]., DO  Subjective:  Diana James is a 34 y.o. year old very pleasant female patient who presents with sinusitis symptoms including nasal congestion, severe sinus pain, green nasal discharge.  -other symptoms include: cough -day of illness:has dealth with current illness at least 4 weeks. 10 days ago treated for URI that was over 2 weeks with azithromycin and prednisone. Chest congestion cleared but had some sinus pressure at that time which has worsened then 3-4 days ago had drastic increase in sinus pressure. Hard to sleep with this.  -Symptoms are worsening -previous treatments: recently finished prednisone and azithromycin -sick contacts/travel/risks: denies flu exposure.  -Hx of: allergies  ROS-denies fever, SOB, NVD, tooth pain  Pertinent Past Medical History-  Patient Active Problem List   Diagnosis Date Noted  . Elevated cholesterol with high triglycerides 10/22/2014  . BMI 36.0-36.9,adult 10/22/2014  . Elevated blood pressure 10/22/2014  . Altered bowel habits 02/21/2014  . Anal fissure 02/21/2014  . Atypical chest pain 09/20/2012  . Tobacco abuse 09/20/2012  . Palpitations 08/24/2012    Medications- reviewed  Current Outpatient Prescriptions  Medication Sig Dispense Refill  . ALPRAZolam (XANAX) 0.5 MG tablet TAKE 1 TABLET EVERY DAY AS NEEDED ANXEITY  2  . cetirizine (ZYRTEC) 10 MG tablet Take 10 mg by mouth daily.    . Cholecalciferol (VITAMIN D PO) Take 1,200 mg by mouth daily.    . fluticasone (FLONASE) 50 MCG/ACT nasal spray Place 2 sprays into both nostrils daily. 16 g 1  . HYDROcodone-homatropine (HYCODAN) 5-1.5 MG/5ML syrup Take 5 mLs by mouth at bedtime as needed for cough (for cough, do not use with xanax or other sedating substances). 120 mL 0  . medroxyPROGESTERone (DEPO-PROVERA) 150 MG/ML injection Inject 150 mg into the muscle every 3 (three) months.    . predniSONE (DELTASONE) 10 MG tablet 40mg  daily for 2 days, then 30mg  daily for 2 days,  then 20 mg daily for 2 days, then 10mg  daily for 2 days 20 tablet 0  . venlafaxine (EFFEXOR) 75 MG tablet Take 75 mg by mouth daily.    Marland Kitchen amoxicillin-clavulanate (AUGMENTIN) 875-125 MG tablet Take 1 tablet by mouth 2 (two) times daily. 20 tablet 0   No current facility-administered medications for this visit.    Objective: BP 120/70 mmHg  Pulse 101  Temp(Src) 98.1 F (36.7 C)  Wt 208 lb (94.348 kg) Gen: NAD, resting comfortably HEENT: Turbinates erythematous with yellow drainage, TM normal, pharynx mildly erythematous with no tonsilar exudate or edema, very tender maxillary and frontal sinus tenderness CV: RRR no murmurs rubs or gallops Lungs: CTAB no crackles, wheeze, rhonchi Abdomen: soft/nontender/nondistended/normal bowel sounds. No rebound or guarding.  Ext: no edema Skin: warm, dry, no rash Neuro: grossly normal, moves all extremities  Assessment/Plan:  Sinsusitis Bacterial based on: Symptoms >10 days and worsening over last 4 days on top of chronic symptoms. Severe sinus pain/pressure.   Treatment: -considered prednisone: patient opted out: gained 4 lbs on last course -other symptomatic care with continue hycodan at night for cough -Antibiotic indicated: yes, treat with Augmentin for 10 days. If doesn't clear would have to consider longer course or potentially levaquin  Finally, we reviewed reasons to return to care including if symptoms worsen or persist or new concerns arise.  Meds ordered this encounter  Medications  . amoxicillin-clavulanate (AUGMENTIN) 875-125 MG tablet    Sig: Take 1 tablet by mouth 2 (two) times daily.    Dispense:  20 tablet  Refill:  0

## 2015-07-30 ENCOUNTER — Encounter: Payer: Self-pay | Admitting: *Deleted

## 2015-07-30 ENCOUNTER — Ambulatory Visit (INDEPENDENT_AMBULATORY_CARE_PROVIDER_SITE_OTHER): Payer: Commercial Managed Care - HMO | Admitting: Family Medicine

## 2015-07-30 ENCOUNTER — Encounter: Payer: Self-pay | Admitting: Family Medicine

## 2015-07-30 VITALS — BP 122/88 | HR 90 | Temp 99.0°F | Ht 63.75 in

## 2015-07-30 DIAGNOSIS — J069 Acute upper respiratory infection, unspecified: Secondary | ICD-10-CM | POA: Diagnosis not present

## 2015-07-30 LAB — POCT INFLUENZA A/B
INFLUENZA A, POC: NEGATIVE
Influenza B, POC: NEGATIVE

## 2015-07-30 MED ORDER — HYDROCODONE-HOMATROPINE 5-1.5 MG/5ML PO SYRP: 5.0000 mL | ORAL_SOLUTION | Freq: Every evening | ORAL | Status: DC | PRN

## 2015-07-30 NOTE — Progress Notes (Signed)
Pre visit review using our clinic review tool, if applicable. No additional management support is needed unless otherwise documented below in the visit note. 

## 2015-07-30 NOTE — Progress Notes (Signed)
HPI:  URI: -started:yesterday -symptoms:nasal congestion, sore throat, cough, mild SOB, low grade fever, fatigue -denies:fever, NVD, tooth pain, rash, joint pain, sinus pain -has tried: nothing - want hycodan cough medication -sick contacts/travel/risks: colleague at work with the same  ROS: See pertinent positives and negatives per HPI.  Past Medical History  Diagnosis Date  . Palpitations   . Anxiety   . Allergic rhinitis   . Tobacco abuse 09/20/2012  . Elevated cholesterol with high triglycerides 10/22/2014  . Elevated blood pressure 10/22/2014  . Obesity     Past Surgical History  Procedure Laterality Date  . Tonsillectomy    . Cesarean section      x2    Family History  Problem Relation Age of Onset  . Cancer - Other Father     Throat  . CAD Neg Hx   . Hypertension Mother   . Hyperlipidemia Mother   . Mental illness Mother   . Celiac disease Mother   . Diabetes Paternal Grandfather     Social History   Social History  . Marital Status: Married    Spouse Name: N/A  . Number of Children: N/A  . 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 Smoking status: Current Every Day Smoker -- 0.20 packs/day for 15 years    Types: Cigarettes  . Smokeless tobacco: Never Used  . Alcohol Use: 6.0 oz/week    12 drink(s) per week  . Drug Use: No  . Sexual Activity: Not Asked   Other Topics Concern  . None   Social History Narrative   Work or School: [employer]      Home Situation: lives with husband and two children      Spiritual Beliefs: none      Lifestyle: no regular exercise; diet is terrible              Current outpatient prescriptions:  .  ALPRAZolam (XANAX) 0.5 MG tablet, TAKE 1 TABLET EVERY DAY AS NEEDED ANXEITY, Disp: , Rfl: 2 .  amoxicillin-clavulanate (AUGMENTIN) 875-125 MG tablet, Take 1 tablet by mouth 2 (two) times daily., Disp: 20 tablet, Rfl: 0 .  cetirizine  (ZYRTEC) 10 MG tablet, Take 10 mg by mouth daily., Disp: , Rfl:  .  Cholecalciferol (VITAMIN D PO), Take 1,200 mg by mouth daily., Disp: , Rfl:  .  fluticasone (FLONASE) 50 MCG/ACT nasal spray, Place 2 sprays into both nostrils daily., Disp: 16 g, Rfl: 1 .  HYDROcodone-homatropine (HYCODAN) 5-1.5 MG/5ML syrup, Take 5 mLs by mouth at bedtime as needed for cough (for cough, do not use with xanax or other sedating substances)., Disp: 120 mL, Rfl: 0 .  medroxyPROGESTERone (DEPO-PROVERA) 150 MG/ML injection, Inject 150 mg into the muscle every 3 (three) months., Disp: , Rfl:  .  predniSONE (DELTASONE) 10 MG tablet, 40mg  daily for 2 days, then 30mg  daily for 2 days, then 20 mg daily for 2 days, then 10mg  daily for 2 days, Disp: 20 tablet, Rfl: 0 .  venlafaxine (EFFEXOR) 75 MG tablet, Take 75 mg by mouth daily., Disp: , Rfl:   EXAM:  Filed Vitals:   07/30/15 1125  BP: 122/88  Pulse: 90  Temp: 99 F (37.2 C)    There is no weight on file to calculate BMI.  GENERAL: vitals reviewed and listed above, alert, oriented, appears well hydrated and in no acute distress  HEENT: atraumatic, conjunttiva clear, no  obvious abnormalities on inspection of external nose and ears, normal appearance of ear canals and TMs, clear nasal congestion, mild post oropharyngeal erythema with PND, no tonsillar edema or exudate, no sinus TTP  NECK: no obvious masses on inspection  LUNGS: clear to auscultation bilaterally, no wheezes, rales or rhonchi, good air movement  CV: HRRR, no peripheral edema  MS: moves all extremities without noticeable abnormality  PSYCH: pleasant and cooperative, no obvious depression or anxiety  ASSESSMENT AND PLAN:  Discussed the following assessment and plan:  Acute upper respiratory infection  -given HPI and exam findings today, a serious infection or illness is unlikely. We discussed potential etiologies, with VURI being most likely, and advised supportive care and monitoring. We  did a rapid flu which was negative and discussed risks/benefit tamiflu if mild influenza and opted against taking this. We discussed treatment side effects, likely course, antibiotic misuse, transmission, and signs of developing a serious illness. -letter seen today provided and advised may return to work if fever < 100 for 24 hours -advised of risks with cough medication she requested and advise to not use in conjunction with xanax - she rarely takes xanax -of course, we advised to return or notify a doctor immediately if symptoms worsen or persist or new concerns arise.    Patient Instructions  INSTRUCTIONS FOR UPPER RESPIRATORY INFECTION:  -plenty of rest and fluids  -nasal saline wash 2-3 times daily (use prepackaged nasal saline or bottled/distilled water if making your own)   -can use AFRIN nasal spray for drainage and nasal congestion - but do NOT use longer then 3-4 days  -can use tylenol (in no history of liver disease) or ibuprofen (if no history of kidney disease, bowel bleeding or significant heart disease) as directed for aches and sorethroat  -in the winter time, using a humidifier at night is helpful (please follow cleaning instructions)  -if you are taking a cough medication - use only as directed, may also try a teaspoon of honey to coat the throat and throat lozenges. If given a cough medication with codeine or hydrocodone or other narcotic please be advised that this contains a strong and  potentially addicting medication. Please follow instructions carefully, take as little as possible and only use AS NEEDED for severe cough. Discuss potential side effects with your pharmacy. Please do not drive or operate machinery while taking these types of medications. Please do not take other sedating medications, drugs or alcohol while taking this medication without discussing with your doctor.  -for sore throat, salt water gargles can help  -follow up if you have fevers, facial  pain, tooth pain, difficulty breathing or are worsening or symptoms persist longer then expected  Upper Respiratory Infection, Adult An upper respiratory infection (URI) is also known as the common cold. It is often caused by a type of germ (virus). Colds are easily spread (contagious). You can pass it to others by kissing, coughing, sneezing, or drinking out of the same glass. Usually, you get better in 1 to 3  weeks.  However, the cough can last for even longer. HOME CARE   Only take medicine as told by your doctor. Follow instructions provided above.  Drink enough water and fluids to keep your pee (urine) clear or pale yellow.  Get plenty of rest.  Return to work when your temperature is < 100 for 24 hours or as told by your doctor. You may use a face mask and wash your hands to stop your cold from  spreading. GET HELP RIGHT AWAY IF:   After the first few days, you feel you are getting worse.  You have questions about your medicine.  You have chills, shortness of breath, or red spit (mucus).  You have pain in the face for more then 1-2 days, especially when you bend forward.  You have a fever, puffy (swollen) neck, pain when you swallow, or white spots in the back of your throat.  You have a bad headache, ear pain, sinus pain, or chest pain.  You have a high-pitched whistling sound when you breathe in and out (wheezing).  You cough up blood.  You have sore muscles or a stiff neck. MAKE SURE YOU:   Understand these instructions.  Will watch your condition.  Will get help right away if you are not doing well or get worse. Document Released: 12/08/2007 Document Revised: 09/13/2011 Document Reviewed: 09/26/2013 [facility] Patient Information 2015 [employer], [place]. This information is not intended to replace advice given to you by your health care provider. Make sure you discuss any questions you have with your health care provider.      Colin Benton R.

## 2015-07-30 NOTE — Addendum Note (Signed)
Addended by: Lahoma Crocker A on: 07/30/2015 12:12 PM   Modules accepted: Orders

## 2015-07-30 NOTE — Patient Instructions (Signed)

## 2015-09-12 ENCOUNTER — Other Ambulatory Visit (INDEPENDENT_AMBULATORY_CARE_PROVIDER_SITE_OTHER): Payer: Self-pay | Admitting: Otolaryngology

## 2015-09-12 DIAGNOSIS — J329 Chronic sinusitis, unspecified: Secondary | ICD-10-CM

## 2015-09-16 ENCOUNTER — Other Ambulatory Visit: Payer: Self-pay | Admitting: Family Medicine

## 2015-09-16 NOTE — Telephone Encounter (Signed)
Can you find out what she is taking this for - does not appear to be chronic med and last rxd a year ago. Thank you.

## 2015-09-16 NOTE — Telephone Encounter (Signed)
I called the pt and she stated she does not need a refill on this.  States she thought she was requesting a refill on a medicine that starts with a "ven" and [facility] which [facility] gets from another doctor.

## 2015-09-17 ENCOUNTER — Ambulatory Visit
Admission: RE | Admit: 2015-09-17 | Discharge: 2015-09-17 | Disposition: A | Discharge status: Home / Self Care | Payer: Commercial Managed Care - HMO | Source: Ambulatory Visit | Attending: Otolaryngology | Admitting: Otolaryngology

## 2015-09-17 DIAGNOSIS — J329 Chronic sinusitis, unspecified: Secondary | ICD-10-CM

## 2016-05-17 ENCOUNTER — Ambulatory Visit: Payer: Commercial Managed Care - HMO

## 2016-05-17 ENCOUNTER — Ambulatory Visit (INDEPENDENT_AMBULATORY_CARE_PROVIDER_SITE_OTHER): Payer: Commercial Managed Care - HMO

## 2016-05-17 ENCOUNTER — Ambulatory Visit (INDEPENDENT_AMBULATORY_CARE_PROVIDER_SITE_OTHER): Payer: Commercial Managed Care - HMO | Admitting: Urgent Care

## 2016-05-17 DIAGNOSIS — M542 Cervicalgia: Secondary | ICD-10-CM

## 2016-05-17 DIAGNOSIS — S40022A Contusion of left upper arm, initial encounter: Secondary | ICD-10-CM

## 2016-05-17 DIAGNOSIS — R0789 Other chest pain: Secondary | ICD-10-CM

## 2016-05-17 DIAGNOSIS — M62830 Muscle spasm of back: Secondary | ICD-10-CM

## 2016-05-17 MED ORDER — CYCLOBENZAPRINE HCL 5 MG PO TABS
5.0000 mg | ORAL_TABLET | Freq: Three times a day (TID) | ORAL | 1 refills | Status: DC | PRN
Start: 2016-05-17 — End: 2016-11-08

## 2016-05-17 MED ORDER — NAPROXEN SODIUM 550 MG PO TABS: 550.0000 mg | ORAL_TABLET | Freq: Two times a day (BID) | ORAL | 1 refills | Status: DC
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
## 2016-10-13 DIAGNOSIS — Z01419 Encounter for gynecological examination (general) (routine) without abnormal findings: Secondary | ICD-10-CM | POA: Diagnosis not present

## 2016-10-21 DIAGNOSIS — N946 Dysmenorrhea, unspecified: Secondary | ICD-10-CM | POA: Diagnosis not present

## 2016-11-08 ENCOUNTER — Encounter: Payer: Self-pay | Admitting: Neurology

## 2016-11-08 ENCOUNTER — Ambulatory Visit (INDEPENDENT_AMBULATORY_CARE_PROVIDER_SITE_OTHER): Payer: Commercial Managed Care - HMO | Admitting: Neurology

## 2016-11-08 VITALS — BP 137/84 | HR 104 | Ht 64.0 in | Wt 222.0 lb

## 2016-11-08 DIAGNOSIS — R0683 Snoring: Secondary | ICD-10-CM | POA: Diagnosis not present

## 2016-11-08 DIAGNOSIS — R5382 Chronic fatigue, unspecified: Secondary | ICD-10-CM | POA: Diagnosis not present

## 2016-11-08 DIAGNOSIS — G4719 Other hypersomnia: Secondary | ICD-10-CM | POA: Diagnosis not present

## 2016-11-08 DIAGNOSIS — R202 Paresthesia of skin: Secondary | ICD-10-CM

## 2016-11-08 DIAGNOSIS — R531 Weakness: Secondary | ICD-10-CM

## 2016-11-08 DIAGNOSIS — R51 Headache: Secondary | ICD-10-CM | POA: Diagnosis not present

## 2016-11-08 DIAGNOSIS — R519 Headache, unspecified: Secondary | ICD-10-CM

## 2016-11-08 NOTE — Progress Notes (Signed)
[facility]    Provider:  Dr Jaynee Eagles Referring Provider: [name], DO Primary Care Physician:  [name], DO  CC:  Muscle weakness  HPI:  Diana James is a 36 y.o. female here as a referral from Dr. Maudie Mercury for muscle weakness. Sometimes she feels weak. It is impossible to lift anything. She can;t even grip her pen. She feels like her knees are going to give out. And her legs feel heavy like it is impossible to lift them. Been ongoing for a year. No inciting events or trauma or anything that would precede the symptoms. No FHx of neuromuscular disorder. Symptoms worse in the morning. She will nod off on the way to work. She snores. Wakes up with headaches. She wakes herself up snoring. Morning and nocturnal headaches. Wakes herself up snoring. The weakness is not every day, sporadic. She denies diplopia or ptosis or changes in voice quality.She endorses diffuse weakness, not proximal. She has to drink coffee al day. Nodding off all day. She has neck pain. Her arms and legs feel heavy. Some days she is fine. Symptoms definitely worse in the morning. Never feels like she has a good nights sleep. No other focal neurologic deficits, associated symptoms, inciting events or modifiable factors.  Reviewed notes, labs and imaging from outside physicians, which showed:   Reviewed notes from primary care. She has complains of waking up fatigue. She knows muscular weakness in the legs and arms and sometimes she has difficulty writing because of this is been ongoing for 2 months. She is managed her anxiety and depression remains on Effexor and Xanax and complains of decreased libido. Does continue to smoke. No urinary or bowel complaints. Exam was unremarkable. She was encouraged to stop smoking. She was treated with naproxen for the painful cramping. For the weight gain the discussed appropriate diet and exercise.  XR cervical spine 05/2016: reviewed report  Reversal of cervical  lordosis question muscle spasm.  Prevertebral soft tissues normal thickness.  Vertebral body and disc space heights maintained.  Bony foramina patent.  No acute fracture, subluxation, or bone destruction.  Lung apices clear.  C1-C2 alignment grossly normal for slight head rotation.  IMPRESSION: Question muscle spasm ; otherwise negative exam.    Review of Systems: Patient complains of symptoms per HPI as well as the following symptoms: Weight gain, fatigue, blurred vision, chest pain, stress of breath, snoring, feeling hot, flushing, spinning sensation, diarrhea, joint pain, cramps, aching muscles, allergies, runny nose, skin sensitivity, memory loss, headache, numbness, weakness, dizziness, tremor, insomnia, sleepiness, snoring, anxiety, not enough sleep, decreased energy, change in appetite, disinterest in activities, racing thoughts. Pertinent negatives per HPI. All others negative.   Social History   Social History  . Marital status: Married    Spouse name: N/A  . Number of children: 2  . Years of education: 10   Occupational History  . 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 Smoking status: Current Every Day Smoker    Packs/day: 0.50    Years: 15.00    Types: Cigarettes  . Smokeless tobacco: Never Used  . Alcohol use 6.0 oz/week    12 Standard drinks or equivalent per week     Comment: 1 drink per day  . Drug use: No  . Sexual activity: Yes   Other Topics Concern  . Not on file   Social History Narrative   Work or School: [employer]  Home Situation: lives with husband and two children      Spiritual Beliefs: none      Lifestyle: no regular exercise; diet is terrible      Right-handed      Caffeine: coffee, tea and sodas throughout the day          Family History  Problem Relation Age of Onset  . Cancer - Other Father     Throat  . Hypertension Mother   . Hyperlipidemia Mother   . Mental illness Mother      Borderline personality d/o, bipolar, manic depressive  . Celiac disease Mother   . Thyroid disease Mother   . Diabetes Paternal Grandfather   . CAD Neg Hx   . Neuromuscular disorder Neg Hx     Past Medical History:  Diagnosis Date  . Allergic rhinitis   . Anxiety   . Elevated blood pressure 10/22/2014  . Elevated cholesterol with high triglycerides 10/22/2014  . Migraine   . Obesity   . Palpitations   . Tobacco abuse 09/20/2012    Past Surgical History:  Procedure Laterality Date  . CESAREAN SECTION  2008, 2010   x2  . TONSILLECTOMY  2000    Current Outpatient Prescriptions  Medication Sig Dispense Refill  . ALPRAZolam (XANAX) 0.25 MG tablet Take by mouth.    . budesonide (RHINOCORT AQUA) 32 MCG/ACT nasal spray Place 2 sprays into both nostrils daily.    . cetirizine (ZYRTEC) 10 MG tablet Take 10 mg by mouth daily.    . naproxen sodium (ANAPROX DS) 550 MG tablet Take 1 tablet (550 mg total) by mouth 2 (two) times daily with a meal. 30 tablet 1  . venlafaxine (EFFEXOR) 75 MG tablet Take 75 mg by mouth daily.     No current facility-administered medications for this visit.     Allergies as of 11/08/2016 - Review Complete 11/08/2016  Allergen Reaction Noted  . Cefdinir Hives 02/07/2014    Vitals: BP 137/84   Pulse (!) 104   Ht 5\' 4"  (1.626 m)   Wt 222 lb (100.7 kg)   BMI 38.11 kg/m  Last Weight:  Wt Readings from Last 1 Encounters:  11/08/16 222 lb (100.7 kg)   Last Height:   Ht Readings from Last 1 Encounters:  11/08/16 5\' 4"  (1.626 m)    Physical exam: Exam: Gen: NAD, conversant, well nourised, obese, well groomed                     CV: RRR, no MRG. No Carotid Bruits. No peripheral edema, warm, nontender Eyes: Conjunctivae clear without exudates or hemorrhage  Neuro: Detailed Neurologic Exam  Speech:    Speech is normal; fluent and spontaneous with normal comprehension.  Cognition:    The patient is oriented to person, place, and time;     recent  and remote memory intact;     language fluent;     normal attention, concentration,     fund of knowledge Cranial Nerves:    The pupils are equal, round, and reactive to light. The fundi are normal and spontaneous venous pulsations are present. Visual fields are full to finger confrontation. Extraocular movements are intact. Trigeminal sensation is intact and the muscles of mastication are normal. The face is symmetric. The palate elevates in the midline. Hearing intact. Voice is normal. Shoulder shrug is normal. The tongue has normal motion without fasciculations.   Coordination:    Normal finger to nose and heel to shin.  Normal rapid alternating movements.   Gait:    Heel-toe and tandem gait are normal.   Motor Observation:    No asymmetry, no atrophy, and no involuntary movements noted. Tone:    Normal muscle tone.    Posture:    Posture is normal. normal erect    Strength:    Strength is V/V in the upper and lower limbs.      Sensation: intact to LT     Reflex Exam:  DTR's:    Deep tendon reflexes in the upper and lower extremities are normal bilaterally.   Toes:    The toes are downgoing bilaterally.   Clonus:    Clonus is absent.  Assessment/Plan:  Obese patient with excessive daytime fatigue(ESS 22), snoring, morning headaches. Also with fatigue and generalized weakness. Will check labs. Needs a sleep evaluation for OSA. Will check labs today. If workup negative can consider emg/ncs and MRI brain.  Orders Placed This Encounter  Procedures  . B12 and Folate Panel  . Hemoglobin A1c  . Methylmalonic acid, serum  . Comprehensive metabolic panel  . CBC  . TSH  . CK  . Ambulatory referral to Sleep Studies     Sarina Ill, MD  [facility] [address] [place], [place] [address]  Phone [phone] Fax [phone]

## 2016-11-08 NOTE — Patient Instructions (Addendum)
Remember to drink plenty of fluid, eat healthy meals and do not skip any meals. Try to eat protein with a every meal and eat a healthy snack such as fruit or nuts in between meals. Try to keep a regular sleep-wake schedule and try to exercise daily, particularly in the form of walking, 20-30 minutes a day, if you can.   As far as diagnostic testing: labs, sleep evaluation  I would like to see you back for sleep evaluation, sooner if we need to. Please call us with any interim questions, concerns, problems, updates or refill requests.   Our phone number is [phone]. We also have an after hours call service for urgent matters and there is a physician on-call for urgent questions. For any emergencies you know to call 911 or go to the nearest emergency room

## 2016-11-09 ENCOUNTER — Telehealth: Payer: Self-pay | Admitting: *Deleted

## 2016-11-09 NOTE — Telephone Encounter (Signed)
I left a detailed message at the Diana James cell number to call the office to schedule an appt with Dr Maudie Mercury per the notes from neurology.

## 2016-11-09 NOTE — Telephone Encounter (Signed)
-----   Message from [name], DO sent at 11/09/2016  2:10 PM EDT -----   ----- Message ----- From: Hope Pigeon, RN Sent: 11/09/2016   1:57 PM To: [name], DO  Here is a copy of patient's recent lab results for your records. She was advised to f/u with you about elevated WBC. Thank you!

## 2016-11-09 NOTE — Telephone Encounter (Signed)
Diana James,  Received message from her neurologist that she had an abnormal CBC and that they told her to follow up with [place] about this. Can you please schedule her an appointment? Thanks!

## 2016-11-09 NOTE — Telephone Encounter (Signed)
-----   Message from Diana Beam, MD sent at 11/09/2016  1:15 PM EDT ----- Labs normal except her WBCs are elevated. They have been elevated int he past. Please forward to pcp.  I would have her follow up with Dr. Maudie Mercury about that to ensure there is no underlying blood disorder if she has not already(I notice WBCs elevated as far back as 9 years ago so maybe she has already been checked out fo rit). Oral steroids can increase WBCs but I don;t see that on her med list, she has some steroid inhaler and maybe that [place] the cause. thanks

## 2016-11-09 NOTE — Telephone Encounter (Signed)
Called and spoke with patient about lab results per AA,MD note. She verbalized understanding and will f/u with PCP. Patient states she was never told in the past about elevated WBC. Advised I will forward results to PCP. Verified PCP correct in EPIC, Dr Maudie Mercury.   Sent copy of labs via EPIC to PCP.

## 2016-11-10 LAB — CBC
Hematocrit: 39.7 % (ref 34.0–46.6)
Hemoglobin: 13.1 g/dL (ref 11.1–15.9)
MCH: 30.4 pg (ref 26.6–33.0)
MCHC: 33 g/dL (ref 31.5–35.7)
MCV: 92 fL (ref 79–97)
PLATELETS: 272 10*3/uL (ref 150–379)
RBC: 4.31 x10E6/uL (ref 3.77–5.28)
RDW: 13.1 % (ref 12.3–15.4)
WBC: 12.1 10*3/uL — ABNORMAL HIGH (ref 3.4–10.8)

## 2016-11-10 LAB — COMPREHENSIVE METABOLIC PANEL
A/G RATIO: 1.7 (ref 1.2–2.2)
ALT: 27 IU/L (ref 0–32)
AST: 23 IU/L (ref 0–40)
Albumin: 4.7 g/dL (ref 3.5–5.5)
Alkaline Phosphatase: 73 IU/L (ref 39–117)
BUN/Creatinine Ratio: 14 (ref 9–23)
BUN: 12 mg/dL (ref 6–20)
Bilirubin Total: 0.4 mg/dL (ref 0.0–1.2)
CALCIUM: 9.9 mg/dL (ref 8.7–10.2)
CO2: 23 mmol/L (ref 18–29)
Chloride: 98 mmol/L (ref 96–106)
Creatinine, Ser: 0.83 mg/dL (ref 0.57–1.00)
GFR calc Af Amer: 105 mL/min/{1.73_m2} (ref >59)
GFR, EST NON AFRICAN AMERICAN: 91 mL/min/{1.73_m2} (ref >59)
GLOBULIN, TOTAL: 2.7 g/dL (ref 1.5–4.5)
Glucose: 86 mg/dL (ref 65–99)
POTASSIUM: 4.3 mmol/L (ref 3.5–5.2)
SODIUM: 138 mmol/L (ref 134–144)
Total Protein: 7.4 g/dL (ref 6.0–8.5)

## 2016-11-10 LAB — B12 AND FOLATE PANEL
Folate: 11.5 ng/mL (ref >3.0)
VITAMIN B 12: 527 pg/mL (ref 232–1245)

## 2016-11-10 LAB — CK: Total CK: 159 U/L (ref 24–173)

## 2016-11-10 LAB — TSH: TSH: 2.32 u[IU]/mL (ref 0.450–4.500)

## 2016-11-10 LAB — HEMOGLOBIN A1C
ESTIMATED AVERAGE GLUCOSE: 108 mg/dL
HEMOGLOBIN A1C: 5.4 % (ref 4.8–5.6)

## 2016-11-10 LAB — METHYLMALONIC ACID, SERUM: Methylmalonic Acid: 148 nmol/L (ref 0–378)

## 2016-11-11 ENCOUNTER — Ambulatory Visit (INDEPENDENT_AMBULATORY_CARE_PROVIDER_SITE_OTHER): Payer: Commercial Managed Care - HMO | Admitting: Family Medicine

## 2016-11-11 ENCOUNTER — Encounter: Payer: Self-pay | Admitting: Family Medicine

## 2016-11-11 VITALS — BP 102/76 | HR 102 | Temp 98.8°F | Ht 64.0 in | Wt 222.9 lb

## 2016-11-11 DIAGNOSIS — E669 Obesity, unspecified: Secondary | ICD-10-CM

## 2016-11-11 DIAGNOSIS — F411 Generalized anxiety disorder: Secondary | ICD-10-CM

## 2016-11-11 DIAGNOSIS — R7989 Other specified abnormal findings of blood chemistry: Secondary | ICD-10-CM

## 2016-11-11 DIAGNOSIS — Z720 Tobacco use: Secondary | ICD-10-CM | POA: Diagnosis not present

## 2016-11-11 NOTE — Patient Instructions (Signed)
BEFORE YOU LEAVE: -follow up: CPE in 2-3 months per pt preference - will plan to do labs that day so morning appointment advised (will plan to check blood counts then as well.)  See sleep specialist as planned.  See psychiatrist as planned.  We recommend the following healthy lifestyle for LIFE: 1) Small portions.   Tip: eat off of a salad plate instead of a dinner plate.  Tip: if you need more or a snack choose fruits, veggies and/or a handful of nuts or seeds.  2) Eat a healthy clean diet.  * Tip: Avoid (less then 1 serving per week): processed foods, sweets, sweetened drinks, white starches (rice, flour, bread, potatoes, pasta, etc), red meat, fast foods, butter  *Tip: CHOOSE instead   * 5-9 servings per day of fresh or frozen fruits and vegetables (but not corn, potatoes, bananas, canned or dried fruit)   *nuts and seeds, beans   *olives and olive oil   *small portions of lean meats such as fish and white chicken    *small portions of whole grains  3)Get at least 150 minutes of sweaty aerobic exercise per week.  4)Reduce stress - consider counseling, meditation and relaxation to balance other aspects of your life.

## 2016-11-11 NOTE — Progress Notes (Signed)
HPI:  Acute visit for abnormal CBC ordered by her neurologist. Reports seeing neurologist for fatigue, anxiety and had many labs drawn. Reports besides the elevated WBC on the labs, testing was ok and now she is waiting on sleep apnea testing. She recently feel asleep while driving and is chronically tired during the day. She continues to smoke is obese and has chronic allergies and acne. Theses al could be contributing to the elevated WBC. Apparently she was told she has had this for > 7 years. In Epic there is a CBC result from 7 years ago with anemia and elevated WBCS. On recent check all was normal except for a CBC of 12.1. She denies CP, cough, hemoptysis, change in bowels, night sweats, fevers, unexplained weight loss of LAD. She also is dealing with chronic GAD and mild depression. Feels like the effexor she has been on for a long time is not working. Apparently saw Dr. Erling Cruz, psychiatry and when tried to wean off effexor had severe anxiety so is back on effexor, is doing better, and plans to see a different psychiatrist.  ROS: See pertinent positives and negatives per HPI.  Past Medical History:  Diagnosis Date  . Allergic rhinitis   . Anxiety   . Elevated blood pressure 10/22/2014  . Elevated cholesterol with high triglycerides 10/22/2014  . Migraine   . Obesity   . Palpitations   . Tobacco abuse 09/20/2012    Past Surgical History:  Procedure Laterality Date  . CESAREAN SECTION  2008, 2010   x2  . TONSILLECTOMY  2000    Family History  Problem Relation Age of Onset  . Cancer - Other Father        Throat  . Hypertension Mother   . Hyperlipidemia Mother   . Mental illness Mother        Borderline personality d/o, bipolar, manic depressive  . Celiac disease Mother   . Thyroid disease Mother   . Diabetes Paternal Grandfather   . CAD Neg Hx   . Neuromuscular disorder Neg Hx     Social History   Social History  . Marital status: Married    Spouse name: N/A  . Number of  children: 2  . Years of education: 10   Occupational History  . 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 Smoking status: Current Every Day Smoker    Packs/day: 0.50    Years: 15.00    Types: Cigarettes  . Smokeless tobacco: Never Used  . Alcohol use 6.0 oz/week    12 Standard drinks or equivalent per week     Comment: 1 drink per day  . Drug use: No  . Sexual activity: Yes   Other Topics Concern  . None   Social History Narrative   Work or School: [employer]      Home Situation: lives with husband and two children      Spiritual Beliefs: none      Lifestyle: no regular exercise; diet is terrible      Right-handed      Caffeine: coffee, tea and sodas throughout the day           Current Outpatient Prescriptions:  .  ALPRAZolam (XANAX) 0.25 MG tablet, Take by mouth., Disp: , Rfl:  .  budesonide (RHINOCORT AQUA) 32 MCG/ACT nasal spray, Place 2 sprays into both nostrils daily., Disp: , Rfl:  .  cetirizine (ZYRTEC) 10 MG tablet, Take 10 mg by mouth daily., Disp: ,  Rfl:  .  naproxen sodium (ANAPROX DS) 550 MG tablet, Take 1 tablet (550 mg total) by mouth 2 (two) times daily with a meal., Disp: 30 tablet, Rfl: 1 .  phentermine (ADIPEX-P) 37.5 MG tablet, TK 1 T PO QD, Disp: , Rfl: 0 .  venlafaxine (EFFEXOR) 75 MG tablet, Take 75 mg by mouth daily., Disp: , Rfl:   EXAM:  Vitals:   11/11/16 1614  BP: 102/76  Pulse: (!) 102  Temp: 98.8 F (37.1 C)    Body mass index is 38.26 kg/m.  GENERAL: vitals reviewed and listed above, alert, oriented, appears well hydrated and in no acute distress  HEENT: atraumatic, conjunttiva clear, no obvious abnormalities on inspection of external nose and ears  NECK: no obvious masses on inspection  LUNGS: clear to auscultation bilaterally, no wheezes, rales or rhonchi, good air movement  CV: HRRR, no peripheral edema  MS: moves all extremities without noticeable abnormality  PSYCH: pleasant and  cooperative, no obvious depression or anxiety  ASSESSMENT AND PLAN:  Discussed the following assessment and plan:  Abnormal CBC  Tobacco abuse  Obesity, unspecified classification, unspecified obesity type, unspecified whether serious comorbidity present  GAD (generalized anxiety disorder)  -mildly elevated WBCs could be from many things including smoking, obesity, ance vs other - we discussed various etiologies and options for evaluation. She opted to recheck again at physical in a few months and consider hematology evaluation if worsening or other findings - advised to attempt smoking cessation and wt reduction in the interim. Unfortunately, she feels until sees the new psychiatrist and can make some medication changes to help anxiety and get sleep testing, both would be challenging. On phentermine from gyn for a few weeks, but has gained wt on this and is concerned about side effects so plans to stop. We considered increasing the effexor but she prefers to wait and see psychiatry.  -Patient advised to return or notify a doctor immediately if symptoms worsen or persist or new concerns arise.  Patient Instructions  BEFORE YOU LEAVE: -follow up: CPE in 2-3 months per pt preference - will plan to do labs that day so morning appointment advised (will plan to check blood counts then as well.)  See sleep specialist as planned.  See psychiatrist as planned.  We recommend the following healthy lifestyle for LIFE: 1) Small portions.   Tip: eat off of a salad plate instead of a dinner plate.  Tip: if you need more or a snack choose fruits, veggies and/or a handful of nuts or seeds.  2) Eat a healthy clean diet.  * Tip: Avoid (less then 1 serving per week): processed foods, sweets, sweetened drinks, white starches (rice, flour, bread, potatoes, pasta, etc), red meat, fast foods, butter  *Tip: CHOOSE instead   * 5-9 servings per day of fresh or frozen fruits and vegetables (but not corn,  potatoes, bananas, canned or dried fruit)   *nuts and seeds, beans   *olives and olive oil   *small portions of lean meats such as fish and white chicken    *small portions of whole grains  3)Get at least 150 minutes of sweaty aerobic exercise per week.  4)Reduce stress - consider counseling, meditation and relaxation to balance other aspects of your life.     Colin Benton R., DO

## 2016-12-01 DIAGNOSIS — R399 Unspecified symptoms and signs involving the genitourinary system: Secondary | ICD-10-CM | POA: Diagnosis not present

## 2016-12-14 DIAGNOSIS — N762 Acute vulvitis: Secondary | ICD-10-CM | POA: Diagnosis not present

## 2016-12-16 DIAGNOSIS — R21 Rash and other nonspecific skin eruption: Secondary | ICD-10-CM | POA: Diagnosis not present

## 2016-12-27 ENCOUNTER — Institutional Professional Consult (permissible substitution): Payer: Commercial Managed Care - HMO | Admitting: Neurology

## 2017-01-25 NOTE — Progress Notes (Signed)
HPI:  Here for CPE: Due for: -?pap - did with gyn, Dr. Radene Knee -labs cbc, lipids - fasting -? sti testing - declined  -Concerns and/or follow up today:  PMH Anxiety, mild depression (seeing psychiatrist), obesity, tobacco use, daytime somnolence (seeing neurology and sleep study was planned last visit), acne, mildly elevated WBCs - planning to recheck this visit. Reports doing ok. Had shingles a few months ago. Has not done OSA testing yet, but plans to schedule. Still smoking. 0.5-1 ppd. Halfway wants to quit. But feels is to hard. Tried patches but the irritate her skin. She feels like "I am going crazy" when she tries to quit.  -Diet: ok, not great  -Exercise: swimming  -Taking folic acid, vitamin D or calcium: no  -Vaccines: UTD  -pap history:reports UTD, does with gyn  -sexual activity: yes, female partner, no new partners  -wants STI testing (Hep C if born 64-65): no  -FH breast, colon or ovarian ca: see FH Last mammogram: n/a Last colon cancer screening: n/a DEXA (>/= 65):   -Alcohol, Tobacco, drug use: see social history  Review of Systems - no fevers, unintentional weight loss, vision loss, hearing loss, chest pain, sob, hemoptysis, melena, hematochezia, hematuria, genital discharge, changing or concerning skin lesions, bleeding, bruising, loc, thoughts of self harm or SI  Past Medical History:  Diagnosis Date  . Allergic rhinitis   . Anxiety   . Elevated blood pressure 10/22/2014  . Elevated cholesterol with high triglycerides 10/22/2014  . History of shingles 11/02/2016   diagnosed and treated by Dr Radene Knee  . Migraine   . Obesity   . Palpitations   . Tobacco abuse 09/20/2012    Past Surgical History:  Procedure Laterality Date  . CESAREAN SECTION  2008, 2010   x2  . TONSILLECTOMY  2000    Family History  Problem Relation Age of Onset  . Cancer - Other Father        Throat  . Hypertension Mother   . Hyperlipidemia Mother   . Mental illness  Mother        Borderline personality d/o, bipolar, manic depressive  . Celiac disease Mother   . Thyroid disease Mother   . Diabetes Paternal Grandfather   . CAD Neg Hx   . Neuromuscular disorder Neg Hx     Social History   Social History  . Marital status: Married    Spouse name: N/A  . Number of children: 2  . Years of education: 10   Occupational History  . 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 Smoking status: Current Every Day Smoker    Packs/day: 0.50    Years: 15.00    Types: Cigarettes  . Smokeless tobacco: Never Used  . Alcohol use 6.0 oz/week    12 Standard drinks or equivalent per week     Comment: 1 drink per day  . Drug use: No  . Sexual activity: Yes   Other Topics Concern  . None   Social History Narrative   Work or School: [employer]      Home Situation: lives with husband and two children      Spiritual Beliefs: none      Lifestyle: no regular exercise; diet is terrible      Right-handed      Caffeine: coffee, tea and sodas throughout the day           Current Outpatient Prescriptions:  .  ALPRAZolam (XANAX) 0.25 MG  tablet, Take by mouth., Disp: , Rfl:  .  budesonide (RHINOCORT AQUA) 32 MCG/ACT nasal spray, Place 2 sprays into both nostrils daily., Disp: , Rfl:  .  cetirizine (ZYRTEC) 10 MG tablet, Take 10 mg by mouth daily., Disp: , Rfl:  .  levonorgestrel (MIRENA) 20 MCG/24HR IUD, 1 each by Intrauterine route once., Disp: , Rfl:  .  naproxen sodium (ANAPROX DS) 550 MG tablet, Take 1 tablet (550 mg total) by mouth 2 (two) times daily with a meal., Disp: 30 tablet, Rfl: 1 .  venlafaxine (EFFEXOR) 75 MG tablet, Take 75 mg by mouth daily., Disp: , Rfl:   EXAM:  Vitals:   01/27/17 0659  BP: 102/72  Pulse: 90  Temp: 98.3 F (36.8 C)  Body mass index is 37.4 kg/m.   GENERAL: vitals reviewed and listed below, alert, oriented, appears well hydrated and in no acute distress  HEENT: head atraumatic, PERRLA,  normal appearance of eyes, ears, nose and mouth. moist mucus membranes.  NECK: supple, no masses or lymphadenopathy  LUNGS: clear to auscultation bilaterally, no rales, rhonchi or wheeze  CV: HRRR, no peripheral edema or cyanosis, normal pedal pulses  ABDOMEN: bowel sounds normal, soft, non tender to palpation, no masses, no rebound or guarding  GU/BREAST: declined, does with gyn  SKIN: no rash or abnormal lesions, declined offered full skin exam  MS: normal gait, moves all extremities normally  NEURO: normal gait, speech and thought processing grossly intact, muscle tone grossly intact throughout  PSYCH: normal affect, pleasant and cooperative  ASSESSMENT AND PLAN:  Discussed the following assessment and plan:  Encounter for preventative adult health care examination  BMI 37.0-37.9, adult  Elevated cholesterol with high triglycerides - Plan: Lipid panel  Abnormal CBC - Plan: CBC with Differential/Platelet  Tobacco use  -advised to quit smoking. Offered help. Discussed motivations, barriers, tx options. She wants to try tapering down on cigs per day then oral nic replacement for cravings. Chew and park gum technique discussed. Counseled about 5 minutes.  -Discussed and advised all [place] preventive services health task force level A and B recommendations for age, sex and risks.  -Advised at least 150 minutes of exercise per week and a healthy diet with avoidance of (less then 1 serving per week) processed foods, white starches, red meat, fast foods and sweets and consisting of: * 5-9 servings of fresh fruits and vegetables (not corn or potatoes) *nuts and seeds, beans *olives and olive oil *lean meats such as fish and white chicken  *whole grains  -labs, studies and vaccines per orders this encounter  Orders Placed This Encounter  Procedures  . CBC with Differential/Platelet  . Lipid panel    Patient advised to return to clinic immediately if symptoms worsen or  persist or new concerns.  Patient Instructions  BEFORE YOU LEAVE: -follow up: yearly and as needed for CPE -labs  Vit D3 1000 IU daily  We have ordered labs or studies at this visit. It can take up to 1-2 weeks for results and processing. IF results require follow up or explanation, we will call you with instructions. Clinically stable results will be released to your [facility]. If you have not heard from [place] or cannot find your results in [facility] in 2 weeks please contact our office at [phone].  If you are not yet signed up for [facility], please consider signing up.   We recommend the following healthy lifestyle for LIFE: 1) Small portions. Regular healthy meals.  2) Eat a healthy  clean diet.   TRY TO EAT: -at least 5-7 servings of low sugar vegetables per day (not corn, potatoes or bananas.) -berries are the best choice if you wish to eat fruit.   -lean meets (fish, chicken or [place] breasts) -vegan proteins for some meals - beans or tofu, whole grains, nuts and seeds -Replace bad fats with good fats - good fats include: fish, nuts and seeds, canola oil, olive oil -small amounts of low fat or non fat dairy -small amounts of100 % whole grains - check the lables  AVOID: -SUGAR, sweets, anything with added sugar, corn syrup or sweeteners -if you must have a sweetener, small amounts of stevia may be best -sweetened beverages -simple starches (rice, bread, potatoes, pasta, chips, etc - small amounts of 100% whole grains are ok) -red meat, pork, butter -fried foods, fast food, processed food, excessive dairy, eggs and coconut.  3)Get at least 150 minutes of sweaty aerobic exercise per week.  4)Reduce stress - consider counseling, meditation and relaxation to balance other aspects of your life.   Health Maintenance, Female Adopting a healthy lifestyle and getting preventive care can go a long way to promote health and wellness. Talk with your health care provider about what  schedule of regular examinations is right for you. This is a good chance for you to check in with your provider about disease prevention and staying healthy. In between checkups, there are plenty of things you can do on your own. Experts have done a lot of research about which lifestyle changes and preventive measures are most likely to keep you healthy. Ask your health care provider for more information. Weight and diet Eat a healthy diet  Be sure to include plenty of vegetables, fruits, low-fat dairy products, and lean protein.  Do not eat a lot of foods high in solid fats, added sugars, or salt.  Get regular exercise. This is one of the most important things you can do for your health. ? Most adults should exercise for at least 150 minutes each week. The exercise should increase your heart rate and make you sweat (moderate-intensity exercise). ? Most adults should also do strengthening exercises at least twice a week. This is in addition to the moderate-intensity exercise.  Maintain a healthy weight  Body mass index (BMI) is a measurement that can be used to identify possible weight problems. It estimates body fat based on height and weight. Your health care provider can help determine your BMI and help you achieve or maintain a healthy weight.  For females 56 years of age and older: ? A BMI below 18.5 is considered underweight. ? A BMI of 18.5 to 24.9 is normal. ? A BMI of 25 to 29.9 is considered overweight. ? A BMI of 30 and above is considered obese.  Watch levels of cholesterol and blood lipids  You should start having your blood tested for lipids and cholesterol at 37 years of age, then have this test every 5 years.  You may need to have your cholesterol levels checked more often if: ? Your lipid or cholesterol levels are high. ? You are older than 37 years of age. ? You are at high risk for heart disease.  Cancer screening Lung Cancer  Lung cancer screening is recommended  for adults 81-34 years old who are at high risk for lung cancer because of a history of smoking.  A yearly low-dose CT scan of the lungs is recommended for people who: ? Currently smoke. ? Have  quit within the past 15 years. ? Have at least a 30-pack-year history of smoking. A pack year is smoking an average of one pack of cigarettes a day for 1 year.  Yearly screening should continue until it has been 15 years since you quit.  Yearly screening should stop if you develop a health problem that would prevent you from having lung cancer treatment.  Breast Cancer  Practice breast self-awareness. This means understanding how your breasts normally appear and feel.  It also means doing regular breast self-exams. Let your health care provider know about any changes, no matter how small.  If you are in your 20s or 30s, you should have a clinical breast exam (CBE) by a health care provider every 1-3 years as part of a regular health exam.  If you are 56 or older, have a CBE every year. Also consider having a breast X-ray (mammogram) every year.  If you have a family history of breast cancer, talk to your health care provider about genetic screening.  If you are at high risk for breast cancer, talk to your health care provider about having an MRI and a mammogram every year.  Breast cancer gene (BRCA) assessment is recommended for women who have family members with BRCA-related cancers. BRCA-related cancers include: ? Breast. ? Ovarian. ? Tubal. ? Peritoneal cancers.  Results of the assessment will determine the need for genetic counseling and BRCA1 and BRCA2 testing.  Cervical Cancer Your health care provider may recommend that you be screened regularly for cancer of the pelvic organs (ovaries, uterus, and vagina). This screening involves a pelvic examination, including checking for microscopic changes to the surface of your cervix (Pap test). You may be encouraged to have this screening done  every 3 years, beginning at age 39.  For women ages 63-65, health care providers may recommend pelvic exams and Pap testing every 3 years, or they may recommend the Pap and pelvic exam, combined with testing for human papilloma virus (HPV), every 5 years. Some types of HPV increase your risk of cervical cancer. Testing for HPV may also be done on women of any age with unclear Pap test results.  Other health care providers may not recommend any screening for nonpregnant women who are considered low risk for pelvic cancer and who do not have symptoms. Ask your health care provider if a screening pelvic exam is right for you.  If you have had past treatment for cervical cancer or a condition that could lead to cancer, you need Pap tests and screening for cancer for at least 20 years after your treatment. If Pap tests have been discontinued, your risk factors (such as having a new sexual partner) need to be reassessed to determine if screening should resume. Some women have medical problems that increase the chance of getting cervical cancer. In these cases, your health care provider may recommend more frequent screening and Pap tests.  Colorectal Cancer  This type of cancer can be detected and often prevented.  Routine colorectal cancer screening usually begins at 37 years of age and continues through 37 years of age.  Your health care provider may recommend screening at an earlier age if you have risk factors for colon cancer.  Your health care provider may also recommend using home test kits to check for hidden blood in the stool.  A small camera at the end of a tube can be used to examine your colon directly (sigmoidoscopy or colonoscopy). This is done to check  for the earliest forms of colorectal cancer.  Routine screening usually begins at age 29.  Direct examination of the colon should be repeated every 5-10 years through 37 years of age. However, you may need to be screened more often if  early forms of precancerous polyps or small growths are found.  Skin Cancer  Check your skin from head to toe regularly.  Tell your health care provider about any new moles or changes in moles, especially if there is a change in a mole's shape or color.  Also tell your health care provider if you have a mole that is larger than the size of a pencil eraser.  Always use sunscreen. Apply sunscreen liberally and repeatedly throughout the day.  Protect yourself by wearing long sleeves, pants, a wide-brimmed hat, and sunglasses whenever you are outside.  Heart disease, diabetes, and high blood pressure  High blood pressure causes heart disease and increases the risk of stroke. High blood pressure is more likely to develop in: ? People who have blood pressure in the high end of the normal range (130-139/85-89 mm Hg). ? People who are overweight or obese. ? People who are African American.  If you are 75-106 years of age, have your blood pressure checked every 3-5 years. If you are 23 years of age or older, have your blood pressure checked every year. You should have your blood pressure measured twice-once when you are at a hospital or clinic, and once when you are not at a hospital or clinic. Record the average of the two measurements. To check your blood pressure when you are not at a hospital or clinic, you can use: ? An automated blood pressure machine at a pharmacy. ? A home blood pressure monitor.  If you are between 54 years and 20 years old, ask your health care provider if you should take aspirin to prevent strokes.  Have regular diabetes screenings. This involves taking a blood sample to check your fasting blood sugar level. ? If you are at a normal weight and have a low risk for diabetes, have this test once every three years after 37 years of age. ? If you are overweight and have a high risk for diabetes, consider being tested at a younger age or more often. Preventing  infection Hepatitis B  If you have a higher risk for hepatitis B, you should be screened for this virus. You are considered at high risk for hepatitis B if: ? You were born in a country where hepatitis B is common. Ask your health care provider which countries are considered high risk. ? Your parents were born in a high-risk country, and you have not been immunized against hepatitis B (hepatitis B vaccine). ? You have HIV or AIDS. ? You use needles to inject street drugs. ? You live with someone who has hepatitis B. ? You have had sex with someone who has hepatitis B. ? You get hemodialysis treatment. ? You take certain medicines for conditions, including cancer, organ transplantation, and autoimmune conditions.  Hepatitis C  Blood testing is recommended for: ? Everyone born from [birth year] through 1966. ? Anyone with known risk factors for hepatitis C.  Sexually transmitted infections (STIs)  You should be screened for sexually transmitted infections (STIs) including gonorrhea and chlamydia if: ? You are sexually active and are younger than 37 years of age. ? You are older than 37 years of age and your health care provider tells you that you are at risk for  this type of infection. ? Your sexual activity has changed since you were last screened and you are at an increased risk for chlamydia or gonorrhea. Ask your health care provider if you are at risk.  If you do not have HIV, but are at risk, it may be recommended that you take a prescription medicine daily to prevent HIV infection. This is called pre-exposure prophylaxis (PrEP). You are considered at risk if: ? You are sexually active and do not regularly use condoms or know the HIV status of your partner(s). ? You take drugs by injection. ? You are sexually active with a partner who has HIV.  Talk with your health care provider about whether you are at high risk of being infected with HIV. If you choose to begin PrEP, you should first be  tested for HIV. You should then be tested every 3 months for as long as you are taking PrEP. Pregnancy  If you are premenopausal and you may become pregnant, ask your health care provider about preconception counseling.  If you may become pregnant, take 400 to 800 micrograms (mcg) of folic acid every day.  If you want to prevent pregnancy, talk to your health care provider about birth control (contraception). Osteoporosis and menopause  Osteoporosis is a disease in which the bones lose minerals and strength with aging. This can result in serious bone fractures. Your risk for osteoporosis can be identified using a bone density scan.  If you are 43 years of age or older, or if you are at risk for osteoporosis and fractures, ask your health care provider if you should be screened.  Ask your health care provider whether you should take a calcium or vitamin D supplement to lower your risk for osteoporosis.  Menopause may have certain physical symptoms and risks.  Hormone replacement therapy may reduce some of these symptoms and risks. Talk to your health care provider about whether hormone replacement therapy is right for you. Follow these instructions at home:  Schedule regular health, dental, and eye exams.  Stay current with your immunizations.  Do not use any tobacco products including cigarettes, chewing tobacco, or electronic cigarettes.  If you are pregnant, do not drink alcohol.  If you are breastfeeding, limit how much and how often you drink alcohol.  Limit alcohol intake to no more than 1 drink per day for nonpregnant women. One drink equals 12 ounces of beer, 5 ounces of wine, or 1 ounces of hard liquor.  Do not use street drugs.  Do not share needles.  Ask your health care provider for help if you need support or information about quitting drugs.  Tell your health care provider if you often feel depressed.  Tell your health care provider if you have ever been abused  or do not feel safe at home. This information is not intended to replace advice given to you by your health care provider. Make sure you discuss any questions you have with your health care provider. Document Released: 01/04/2011 Document Revised: 11/27/2015 Document Reviewed: 03/25/2015 Elsevier Interactive Patient Education  2018 [employer] NOW OFFER   [facility] FAST TRACK!!!  SAME DAY Appointments for ACUTE CARE  Such as: Sprains, Injuries, cuts, abrasions, rashes, muscle pain, joint pain, back pain Colds, flu, sore throats, headache, allergies, cough, fever  Ear pain, sinus and eye infections Abdominal pain, nausea, vomiting, diarrhea, upset stomach Animal/insect bites  3 Easy Ways to Schedule: Walk-In Scheduling Call in scheduling Mychart Sign-up: [email or web address]  No Follow-up on file.  Colin Benton R., DO

## 2017-01-27 ENCOUNTER — Encounter: Payer: Self-pay | Admitting: Family Medicine

## 2017-01-27 ENCOUNTER — Telehealth: Payer: Self-pay | Admitting: Family Medicine

## 2017-01-27 ENCOUNTER — Ambulatory Visit (INDEPENDENT_AMBULATORY_CARE_PROVIDER_SITE_OTHER): Payer: 59 | Admitting: Family Medicine

## 2017-01-27 VITALS — BP 102/72 | HR 90 | Temp 98.3°F | Ht 64.0 in | Wt 217.9 lb

## 2017-01-27 DIAGNOSIS — R7989 Other specified abnormal findings of blood chemistry: Secondary | ICD-10-CM | POA: Diagnosis not present

## 2017-01-27 DIAGNOSIS — Z6837 Body mass index (BMI) 37.0-37.9, adult: Secondary | ICD-10-CM

## 2017-01-27 DIAGNOSIS — Z720 Tobacco use: Secondary | ICD-10-CM

## 2017-01-27 DIAGNOSIS — E782 Mixed hyperlipidemia: Secondary | ICD-10-CM | POA: Diagnosis not present

## 2017-01-27 DIAGNOSIS — Z0000 Encounter for general adult medical examination without abnormal findings: Secondary | ICD-10-CM | POA: Diagnosis not present

## 2017-01-27 LAB — CBC WITH DIFFERENTIAL/PLATELET
BASOS PCT: 0.8 % (ref 0.0–3.0)
Basophils Absolute: 0.1 10*3/uL (ref 0.0–0.1)
EOS ABS: 0.3 10*3/uL (ref 0.0–0.7)
EOS PCT: 2.3 % (ref 0.0–5.0)
HEMATOCRIT: 42.7 % (ref 36.0–46.0)
Hemoglobin: 14.3 g/dL (ref 12.0–15.0)
LYMPHS PCT: 23.1 % (ref 12.0–46.0)
Lymphs Abs: 2.5 10*3/uL (ref 0.7–4.0)
MCHC: 33.4 g/dL (ref 30.0–36.0)
MCV: 94.3 fl (ref 78.0–100.0)
Monocytes Absolute: 0.8 10*3/uL (ref 0.1–1.0)
Monocytes Relative: 7.2 % (ref 3.0–12.0)
NEUTROS ABS: 7.3 10*3/uL (ref 1.4–7.7)
Neutrophils Relative %: 66.6 % (ref 43.0–77.0)
PLATELETS: 254 10*3/uL (ref 150.0–400.0)
RBC: 4.53 Mil/uL (ref 3.87–5.11)
RDW: 13.1 % (ref 11.5–15.5)
WBC: 10.9 10*3/uL — ABNORMAL HIGH (ref 4.0–10.5)

## 2017-01-27 LAB — LIPID PANEL
CHOL/HDL RATIO: 6
Cholesterol: 169 mg/dL (ref 0–200)
HDL: 28.6 mg/dL — ABNORMAL LOW (ref >39.00)
LDL Cholesterol: 104 mg/dL — ABNORMAL HIGH (ref 0–99)
NONHDL: 140.79
Triglycerides: 185 mg/dL — ABNORMAL HIGH (ref 0.0–149.0)
VLDL: 37 mg/dL (ref 0.0–40.0)

## 2017-01-27 NOTE — Addendum Note (Signed)
Addended by: Agnes Lawrence on: 01/27/2017 06:01 PM   Modules accepted: Orders

## 2017-01-27 NOTE — Patient Instructions (Signed)
BEFORE YOU LEAVE: -follow up: yearly and as needed for CPE -labs  Vit D3 1000 IU daily  We have ordered labs or studies at this visit. It can take up to 1-2 weeks for results and processing. IF results require follow up or explanation, we will call you with instructions. Clinically stable results will be released to your [facility]. If you have not heard from [place] or cannot find your results in [facility] in 2 weeks please contact our office at [phone].  If you are not yet signed up for [facility], please consider signing up.   We recommend the following healthy lifestyle for LIFE: 1) Small portions. Regular healthy meals.  2) Eat a healthy clean diet.   TRY TO EAT: -at least 5-7 servings of low sugar vegetables per day (not corn, potatoes or bananas.) -berries are the best choice if you wish to eat fruit.   -lean meets (fish, chicken or [place] breasts) -vegan proteins for some meals - beans or tofu, whole grains, nuts and seeds -Replace bad fats with good fats - good fats include: fish, nuts and seeds, canola oil, olive oil -small amounts of low fat or non fat dairy -small amounts of100 % whole grains - check the lables  AVOID: -SUGAR, sweets, anything with added sugar, corn syrup or sweeteners -if you must have a sweetener, small amounts of stevia may be best -sweetened beverages -simple starches (rice, bread, potatoes, pasta, chips, etc - small amounts of 100% whole grains are ok) -red meat, pork, butter -fried foods, fast food, processed food, excessive dairy, eggs and coconut.  3)Get at least 150 minutes of sweaty aerobic exercise per week.  4)Reduce stress - consider counseling, meditation and relaxation to balance other aspects of your life.   Health Maintenance, Female Adopting a healthy lifestyle and getting preventive care can go a long way to promote health and wellness. Talk with your health care provider about what schedule of regular examinations is right for you. This  is a good chance for you to check in with your provider about disease prevention and staying healthy. In between checkups, there are plenty of things you can do on your own. Experts have done a lot of research about which lifestyle changes and preventive measures are most likely to keep you healthy. Ask your health care provider for more information. Weight and diet Eat a healthy diet  Be sure to include plenty of vegetables, fruits, low-fat dairy products, and lean protein.  Do not eat a lot of foods high in solid fats, added sugars, or salt.  Get regular exercise. This is one of the most important things you can do for your health. ? Most adults should exercise for at least 150 minutes each week. The exercise should increase your heart rate and make you sweat (moderate-intensity exercise). ? Most adults should also do strengthening exercises at least twice a week. This is in addition to the moderate-intensity exercise.  Maintain a healthy weight  Body mass index (BMI) is a measurement that can be used to identify possible weight problems. It estimates body fat based on height and weight. Your health care provider can help determine your BMI and help you achieve or maintain a healthy weight.  For females 57 years of age and older: ? A BMI below 18.5 is considered underweight. ? A BMI of 18.5 to 24.9 is normal. ? A BMI of 25 to 29.9 is considered overweight. ? A BMI of 30 and above is considered obese.  Watch  levels of cholesterol and blood lipids  You should start having your blood tested for lipids and cholesterol at 37 years of age, then have this test every 5 years.  You may need to have your cholesterol levels checked more often if: ? Your lipid or cholesterol levels are high. ? You are older than 37 years of age. ? You are at high risk for heart disease.  Cancer screening Lung Cancer  Lung cancer screening is recommended for adults 45-43 years old who are at high risk for  lung cancer because of a history of smoking.  A yearly low-dose CT scan of the lungs is recommended for people who: ? Currently smoke. ? Have quit within the past 15 years. ? Have at least a 30-pack-year history of smoking. A pack year is smoking an average of one pack of cigarettes a day for 1 year.  Yearly screening should continue until it has been 15 years since you quit.  Yearly screening should stop if you develop a health problem that would prevent you from having lung cancer treatment.  Breast Cancer  Practice breast self-awareness. This means understanding how your breasts normally appear and feel.  It also means doing regular breast self-exams. Let your health care provider know about any changes, no matter how small.  If you are in your 20s or 30s, you should have a clinical breast exam (CBE) by a health care provider every 1-3 years as part of a regular health exam.  If you are 32 or older, have a CBE every year. Also consider having a breast X-ray (mammogram) every year.  If you have a family history of breast cancer, talk to your health care provider about genetic screening.  If you are at high risk for breast cancer, talk to your health care provider about having an MRI and a mammogram every year.  Breast cancer gene (BRCA) assessment is recommended for women who have family members with BRCA-related cancers. BRCA-related cancers include: ? Breast. ? Ovarian. ? Tubal. ? Peritoneal cancers.  Results of the assessment will determine the need for genetic counseling and BRCA1 and BRCA2 testing.  Cervical Cancer Your health care provider may recommend that you be screened regularly for cancer of the pelvic organs (ovaries, uterus, and vagina). This screening involves a pelvic examination, including checking for microscopic changes to the surface of your cervix (Pap test). You may be encouraged to have this screening done every 3 years, beginning at age 95.  For women  ages 45-65, health care providers may recommend pelvic exams and Pap testing every 3 years, or they may recommend the Pap and pelvic exam, combined with testing for human papilloma virus (HPV), every 5 years. Some types of HPV increase your risk of cervical cancer. Testing for HPV may also be done on women of any age with unclear Pap test results.  Other health care providers may not recommend any screening for nonpregnant women who are considered low risk for pelvic cancer and who do not have symptoms. Ask your health care provider if a screening pelvic exam is right for you.  If you have had past treatment for cervical cancer or a condition that could lead to cancer, you need Pap tests and screening for cancer for at least 20 years after your treatment. If Pap tests have been discontinued, your risk factors (such as having a new sexual partner) need to be reassessed to determine if screening should resume. Some women have medical problems that increase the  chance of getting cervical cancer. In these cases, your health care provider may recommend more frequent screening and Pap tests.  Colorectal Cancer  This type of cancer can be detected and often prevented.  Routine colorectal cancer screening usually begins at 37 years of age and continues through 37 years of age.  Your health care provider may recommend screening at an earlier age if you have risk factors for colon cancer.  Your health care provider may also recommend using home test kits to check for hidden blood in the stool.  A small camera at the end of a tube can be used to examine your colon directly (sigmoidoscopy or colonoscopy). This is done to check for the earliest forms of colorectal cancer.  Routine screening usually begins at age 30.  Direct examination of the colon should be repeated every 5-10 years through 37 years of age. However, you may need to be screened more often if early forms of precancerous polyps or small growths  are found.  Skin Cancer  Check your skin from head to toe regularly.  Tell your health care provider about any new moles or changes in moles, especially if there is a change in a mole's shape or color.  Also tell your health care provider if you have a mole that is larger than the size of a pencil eraser.  Always use sunscreen. Apply sunscreen liberally and repeatedly throughout the day.  Protect yourself by wearing long sleeves, pants, a wide-brimmed hat, and sunglasses whenever you are outside.  Heart disease, diabetes, and high blood pressure  High blood pressure causes heart disease and increases the risk of stroke. High blood pressure is more likely to develop in: ? People who have blood pressure in the high end of the normal range (130-139/85-89 mm Hg). ? People who are overweight or obese. ? People who are African American.  If you are 31-97 years of age, have your blood pressure checked every 3-5 years. If you are 40 years of age or older, have your blood pressure checked every year. You should have your blood pressure measured twice-once when you are at a hospital or clinic, and once when you are not at a hospital or clinic. Record the average of the two measurements. To check your blood pressure when you are not at a hospital or clinic, you can use: ? An automated blood pressure machine at a pharmacy. ? A home blood pressure monitor.  If you are between 72 years and 19 years old, ask your health care provider if you should take aspirin to prevent strokes.  Have regular diabetes screenings. This involves taking a blood sample to check your fasting blood sugar level. ? If you are at a normal weight and have a low risk for diabetes, have this test once every three years after 37 years of age. ? If you are overweight and have a high risk for diabetes, consider being tested at a younger age or more often. Preventing infection Hepatitis B  If you have a higher risk for hepatitis  B, you should be screened for this virus. You are considered at high risk for hepatitis B if: ? You were born in a country where hepatitis B is common. Ask your health care provider which countries are considered high risk. ? Your parents were born in a high-risk country, and you have not been immunized against hepatitis B (hepatitis B vaccine). ? You have HIV or AIDS. ? You use needles to inject street drugs. ?  You live with someone who has hepatitis B. ? You have had sex with someone who has hepatitis B. ? You get hemodialysis treatment. ? You take certain medicines for conditions, including cancer, organ transplantation, and autoimmune conditions.  Hepatitis C  Blood testing is recommended for: ? Everyone born from [birth year] through 1966. ? Anyone with known risk factors for hepatitis C.  Sexually transmitted infections (STIs)  You should be screened for sexually transmitted infections (STIs) including gonorrhea and chlamydia if: ? You are sexually active and are younger than 37 years of age. ? You are older than 37 years of age and your health care provider tells you that you are at risk for this type of infection. ? Your sexual activity has changed since you were last screened and you are at an increased risk for chlamydia or gonorrhea. Ask your health care provider if you are at risk.  If you do not have HIV, but are at risk, it may be recommended that you take a prescription medicine daily to prevent HIV infection. This is called pre-exposure prophylaxis (PrEP). You are considered at risk if: ? You are sexually active and do not regularly use condoms or know the HIV status of your partner(s). ? You take drugs by injection. ? You are sexually active with a partner who has HIV.  Talk with your health care provider about whether you are at high risk of being infected with HIV. If you choose to begin PrEP, you should first be tested for HIV. You should then be tested every 3 months for as  long as you are taking PrEP. Pregnancy  If you are premenopausal and you may become pregnant, ask your health care provider about preconception counseling.  If you may become pregnant, take 400 to 800 micrograms (mcg) of folic acid every day.  If you want to prevent pregnancy, talk to your health care provider about birth control (contraception). Osteoporosis and menopause  Osteoporosis is a disease in which the bones lose minerals and strength with aging. This can result in serious bone fractures. Your risk for osteoporosis can be identified using a bone density scan.  If you are 20 years of age or older, or if you are at risk for osteoporosis and fractures, ask your health care provider if you should be screened.  Ask your health care provider whether you should take a calcium or vitamin D supplement to lower your risk for osteoporosis.  Menopause may have certain physical symptoms and risks.  Hormone replacement therapy may reduce some of these symptoms and risks. Talk to your health care provider about whether hormone replacement therapy is right for you. Follow these instructions at home:  Schedule regular health, dental, and eye exams.  Stay current with your immunizations.  Do not use any tobacco products including cigarettes, chewing tobacco, or electronic cigarettes.  If you are pregnant, do not drink alcohol.  If you are breastfeeding, limit how much and how often you drink alcohol.  Limit alcohol intake to no more than 1 drink per day for nonpregnant women. One drink equals 12 ounces of beer, 5 ounces of wine, or 1 ounces of hard liquor.  Do not use street drugs.  Do not share needles.  Ask your health care provider for help if you need support or information about quitting drugs.  Tell your health care provider if you often feel depressed.  Tell your health care provider if you have ever been abused or do not feel safe at  home. This information is not intended  to replace advice given to you by your health care provider. Make sure you discuss any questions you have with your health care provider. Document Released: 01/04/2011 Document Revised: 11/27/2015 Document Reviewed: 03/25/2015 Elsevier Interactive Patient Education  2018 [place] NOW OFFER    [facility] FAST TRACK!!!  SAME DAY Appointments for ACUTE CARE  Such as: Sprains, Injuries, cuts, abrasions, rashes, muscle pain, joint pain, back pain Colds, flu, sore throats, headache, allergies, cough, fever  Ear pain, sinus and eye infections Abdominal pain, nausea, vomiting, diarrhea, upset stomach Animal/insect bites  3 Easy Ways to Schedule: Walk-In Scheduling Call in scheduling Mychart Sign-up: [email or web address]

## 2017-01-27 NOTE — Telephone Encounter (Signed)
Pt was able to see blood work results on [employer] and now would like nurse to return her call

## 2017-01-27 NOTE — Telephone Encounter (Signed)
See results note. 

## 2017-01-31 ENCOUNTER — Institutional Professional Consult (permissible substitution): Payer: Commercial Managed Care - HMO | Admitting: Neurology

## 2017-03-08 DIAGNOSIS — N760 Acute vaginitis: Secondary | ICD-10-CM | POA: Diagnosis not present

## 2017-03-16 DIAGNOSIS — L258 Unspecified contact dermatitis due to other agents: Secondary | ICD-10-CM | POA: Diagnosis not present

## 2017-03-16 DIAGNOSIS — L738 Other specified follicular disorders: Secondary | ICD-10-CM | POA: Diagnosis not present

## 2017-03-16 DIAGNOSIS — L02229 Furuncle of trunk, unspecified: Secondary | ICD-10-CM | POA: Diagnosis not present

## 2017-03-24 ENCOUNTER — Encounter: Payer: Self-pay | Admitting: Family Medicine

## 2017-04-25 ENCOUNTER — Other Ambulatory Visit: Payer: Self-pay

## 2017-04-26 ENCOUNTER — Other Ambulatory Visit (INDEPENDENT_AMBULATORY_CARE_PROVIDER_SITE_OTHER): Payer: 59

## 2017-04-26 DIAGNOSIS — E782 Mixed hyperlipidemia: Secondary | ICD-10-CM

## 2017-04-26 LAB — LIPID PANEL
CHOL/HDL RATIO: 5
Cholesterol: 158 mg/dL (ref 0–200)
HDL: 32.9 mg/dL — ABNORMAL LOW (ref >39.00)
LDL CALC: 88 mg/dL (ref 0–99)
NONHDL: 124.95
TRIGLYCERIDES: 184 mg/dL — AB (ref 0.0–149.0)
VLDL: 36.8 mg/dL (ref 0.0–40.0)

## 2017-05-02 ENCOUNTER — Ambulatory Visit: Payer: 59 | Admitting: Family Medicine

## 2017-05-04 DIAGNOSIS — J029 Acute pharyngitis, unspecified: Secondary | ICD-10-CM | POA: Diagnosis not present

## 2017-07-11 ENCOUNTER — Ambulatory Visit: Payer: 59 | Admitting: Family Medicine

## 2017-07-14 ENCOUNTER — Encounter: Payer: Self-pay | Admitting: Family Medicine

## 2017-08-05 DIAGNOSIS — H40023 Open angle with borderline findings, high risk, bilateral: Secondary | ICD-10-CM | POA: Diagnosis not present

## 2017-09-26 DIAGNOSIS — M542 Cervicalgia: Secondary | ICD-10-CM | POA: Diagnosis not present

## 2017-10-10 DIAGNOSIS — B308 Other viral conjunctivitis: Secondary | ICD-10-CM | POA: Diagnosis not present

## 2017-11-11 ENCOUNTER — Ambulatory Visit: Payer: 59 | Admitting: Adult Health

## 2017-11-11 ENCOUNTER — Encounter: Payer: Self-pay | Admitting: Adult Health

## 2017-11-11 ENCOUNTER — Ambulatory Visit: Payer: Self-pay | Admitting: *Deleted

## 2017-11-11 VITALS — BP 110/80 | Temp 98.8°F | Wt 221.0 lb

## 2017-11-11 DIAGNOSIS — R1084 Generalized abdominal pain: Secondary | ICD-10-CM | POA: Diagnosis not present

## 2017-11-11 LAB — CBC WITH DIFFERENTIAL/PLATELET
BASOS ABS: 0.1 10*3/uL (ref 0.0–0.1)
Basophils Relative: 0.9 % (ref 0.0–3.0)
Eosinophils Absolute: 0.2 10*3/uL (ref 0.0–0.7)
Eosinophils Relative: 1.9 % (ref 0.0–5.0)
HEMATOCRIT: 39.8 % (ref 36.0–46.0)
Hemoglobin: 13.4 g/dL (ref 12.0–15.0)
LYMPHS PCT: 24.3 % (ref 12.0–46.0)
Lymphs Abs: 2.8 10*3/uL (ref 0.7–4.0)
MCHC: 33.6 g/dL (ref 30.0–36.0)
MCV: 91.4 fl (ref 78.0–100.0)
MONOS PCT: 7.8 % (ref 3.0–12.0)
Monocytes Absolute: 0.9 10*3/uL (ref 0.1–1.0)
NEUTROS ABS: 7.5 10*3/uL (ref 1.4–7.7)
Neutrophils Relative %: 65.1 % (ref 43.0–77.0)
PLATELETS: 254 10*3/uL (ref 150.0–400.0)
RBC: 4.35 Mil/uL (ref 3.87–5.11)
RDW: 13 % (ref 11.5–15.5)
WBC: 11.4 10*3/uL — AB (ref 4.0–10.5)

## 2017-11-11 LAB — POCT URINALYSIS DIPSTICK
Bilirubin, UA: NEGATIVE
Blood, UA: NEGATIVE
Glucose, UA: NEGATIVE
Ketones, UA: NEGATIVE
LEUKOCYTES UA: NEGATIVE
NITRITE UA: NEGATIVE
PH UA: 6 (ref 5.0–8.0)
PROTEIN UA: NEGATIVE
Spec Grav, UA: >=1.03 — AB (ref 1.010–1.025)
UROBILINOGEN UA: 0.2 U/dL

## 2017-11-11 LAB — BASIC METABOLIC PANEL
BUN: 8 mg/dL (ref 6–23)
CHLORIDE: 103 meq/L (ref 96–112)
CO2: 27 mEq/L (ref 19–32)
Calcium: 9.7 mg/dL (ref 8.4–10.5)
Creatinine, Ser: 0.76 mg/dL (ref 0.40–1.20)
GFR: 90.81 mL/min (ref >60.00)
Glucose, Bld: 79 mg/dL (ref 70–99)
POTASSIUM: 4.3 meq/L (ref 3.5–5.1)
Sodium: 137 mEq/L (ref 135–145)

## 2017-11-11 LAB — LIPASE: Lipase: 59 U/L (ref 11.0–59.0)

## 2017-11-11 MED ORDER — RANITIDINE HCL 75 MG PO TABS: 75.0000 mg | ORAL_TABLET | Freq: Two times a day (BID) | ORAL | 0 refills | Status: DC

## 2017-11-11 NOTE — Telephone Encounter (Signed)
Patient is calling to report that she has had diarrhea for 1 week- she complains of ULQ pain as well. Appointment made for patient.

## 2017-11-11 NOTE — Telephone Encounter (Signed)
Patient is on the schedule for today to see [facility] 11/11/2017.

## 2017-11-11 NOTE — Telephone Encounter (Signed)
  Reason for Disposition . [1] MODERATE diarrhea (e.g., 4-6 times / day more than normal) AND [2] present > 48 hours (2 days)  Answer Assessment - Initial Assessment Questions 1. DIARRHEA SEVERITY: "How bad is the diarrhea?" "How many extra stools have you had in the past 24 hours than normal?"    - MILD: Few loose or mushy BMs; increase of 1-3 stools over normal daily number of stools; mild increase in ostomy output.   - MODERATE: Increase of 4-6 stools daily over normal; moderate increase in ostomy output.   - SEVERE (or Worst Possible): Increase of 7 or more stools daily over normal; moderate increase in ostomy output; incontinence.     Moderate- this has been going on for a week 2. ONSET: "When did the diarrhea begin?"      1 week 3. BM CONSISTENCY: "How loose or watery is the diarrhea?"      water 4. VOMITING: "Are you also vomiting?" If so, ask: "How many times in the past 24 hours?"      Nausea- no vomiting 5. ABDOMINAL PAIN: "Are you having any abdominal pain?" If yes: "What does it feel like?" (e.g., crampy, dull, intermittent, constant)      Upper left- couple days- feel swollen and render to touch 6. ABDOMINAL PAIN SEVERITY: If present, ask: "How bad is the pain?"  (e.g., Scale 1-10; mild, moderate, or severe)    - MILD (1-3): doesn't interfere with normal activities, abdomen soft and not tender to touch     - MODERATE (4-7): interferes with normal activities or awakens from sleep, tender to touch     - SEVERE (8-10): excruciating pain, doubled over, unable to do any normal activities       moderate 7. ORAL INTAKE: If vomiting, "Have you been able to drink liquids?" "How much fluids have you had in the past 24 hours?"     Yes patient is hydrating- patient is not increasing fluids  8. HYDRATION: "Any signs of dehydration?" (e.g., dry mouth [not just dry lips], too weak to stand, dizziness, new weight loss) "When did you last urinate?"     No- urination- this morning 9. EXPOSURE:  "Have you traveled to a foreign country recently?" "Have you been exposed to anyone with diarrhea?" "Could you have eaten any food that was spoiled?"     no 10. OTHER SYMPTOMS: "Do you have any other symptoms?" (e.g., fever, blood in stool)       Redness in stool- patient is not sure if that is blood 11. PREGNANCY: "Is there any chance you are pregnant?" "When was your last menstrual period?"       No- LMP-2 weeks ago- IUD  Protocols used: DIARRHEA-A-AH

## 2017-11-11 NOTE — Progress Notes (Signed)
Subjective:    Patient ID: Diana James, female    DOB: 1981/06/24, 37 y.o.   MRN: [id number]  HPI 37 year old female who  has a past medical history of Allergic rhinitis, Anxiety, Elevated blood pressure (10/22/2014), Elevated cholesterol with high triglycerides (10/22/2014), History of shingles (11/02/2016), Migraine, Obesity, Palpitations, and Tobacco abuse (09/20/2012).  She is a patient of Dr. Maudie Mercury, who I am seeing today for an acute issue of diarrhea and abdominal pain x 1 week. Reports that diarrhea started first about 7 days, she has not had a formed BM in the last week. Denies any acute blood in stool. Has not had any recent travel, prescribed any antibiotics or been around sick contacts. Over the last 2-3 days she has been experiencing epigastric and left upper quadrant pain, " a lot of heart burn" and loss of appetite. She vomited once. Has taken Prilosec in the past but noticed that it causes constipation when she takes it. Has tried Imodium but did not help resolve diarrhea   She does not drink in excess. Does smoke.   She also does report a " funky" smell to her urine and has noticed discoloration to urine. She denies frequency, urgency, or dysuria. No history of kidney stones.    Review of Systems See HPI   Past Medical History:  Diagnosis Date  . Allergic rhinitis   . Anxiety   . Elevated blood pressure 10/22/2014  . Elevated cholesterol with high triglycerides 10/22/2014  . History of shingles 11/02/2016   diagnosed and treated by Dr Radene Knee  . Migraine   . Obesity   . Palpitations   . Tobacco abuse 09/20/2012    Social History   Socioeconomic History  . Marital status: Married    Spouse name: Not on file  . Number of children: 2  . Years of education: 10  . Highest education level: Not on file  Occupational History  . Occupation: Insurance risk surveyor  Social Needs  . Financial resource strain: Not on file  . Food insecurity:    Worry: Not on file   Inability: Not on file  . Transportation needs:    Medical: Not on file    Non-medical: Not on file  Tobacco Use  . Smoking status: Current Every Day Smoker    Packs/day: 0.50    Years: 15.00    Pack years: 7.50    Types: Cigarettes  . Smokeless tobacco: Never Used  Substance and Sexual Activity  . Alcohol use: Yes    Alcohol/week: 6.0 oz    Types: 12 Standard drinks or equivalent per week    Comment: 1 drink per day  . Drug use: No  . Sexual activity: Yes  Lifestyle  . Physical activity:    Days per week: Not on file    Minutes per session: Not on file  . Stress: Not on file  Relationships  . Social connections:    Talks on phone: Not on file    Gets together: Not on file    Attends religious service: Not on file    Active member of club or organization: Not on file    Attends meetings of clubs or organizations: Not on file    Relationship status: Not on file  . Intimate partner violence:    Fear of current or ex partner: Not on file    Emotionally abused: Not on file    Physically abused: Not on file    Forced sexual activity:  Not on file  Other Topics Concern  . Not on file  Social History Narrative   Work or School: [employer] post [employer]      Home Situation: lives with husband and two children      Spiritual Beliefs: none      Lifestyle: no regular exercise; diet is terrible      Right-handed      Caffeine: coffee, tea and sodas throughout the day       Past Surgical History:  Procedure Laterality Date  . CESAREAN SECTION  2008, 2010   x2  . TONSILLECTOMY  2000    Family History  Problem Relation Age of Onset  . Cancer - Other Father        Throat  . Hypertension Mother   . Hyperlipidemia Mother   . Mental illness Mother        Borderline personality d/o, bipolar, manic depressive  . Celiac disease Mother   . Thyroid disease Mother   . Diabetes Paternal Grandfather   . CAD Neg Hx   . Neuromuscular disorder Neg Hx     Allergies  Allergen  Reactions  . Cefdinir Hives    Current Outpatient Medications on File Prior to Visit  Medication Sig Dispense Refill  . budesonide (RHINOCORT AQUA) 32 MCG/ACT nasal spray Place 2 sprays into both nostrils daily.    . cetirizine (ZYRTEC) 10 MG tablet Take 10 mg by mouth daily.    Marland Kitchen levonorgestrel (MIRENA) 20 MCG/24HR IUD 1 each by Intrauterine route once.    . naproxen sodium (ANAPROX DS) 550 MG tablet Take 1 tablet (550 mg total) by mouth 2 (two) times daily with a meal. 30 tablet 1  . venlafaxine (EFFEXOR) 75 MG tablet Take 75 mg by mouth daily.    Marland Kitchen ALPRAZolam (XANAX) 0.25 MG tablet Take by mouth.     No current facility-administered medications on file prior to visit.     BP 110/80   Temp 98.8 F (37.1 C) (Oral)   Wt 221 lb (100.2 kg)   BMI 37.93 kg/m       Objective:   Physical Exam  Constitutional: She is oriented to person, place, and time. She appears well-developed and well-nourished. No distress.  Cardiovascular: Normal rate, regular rhythm, normal heart sounds and intact distal pulses. Exam reveals no gallop and no friction rub.  No murmur heard. Pulmonary/Chest: Effort normal and breath sounds normal. No stridor. No respiratory distress. She has no wheezes. She has no rales. She exhibits no tenderness.  Abdominal: Soft. Normal appearance and bowel sounds are normal. She exhibits no distension, no ascites and no mass. There is tenderness in the epigastric area, periumbilical area and left upper quadrant. There is no rigidity, no rebound, no guarding, no CVA tenderness, no tenderness at McBurney's point and negative Murphy's sign. No hernia.  Neurological: She is alert and oriented to person, place, and time.  Skin: Skin is warm and dry. Capillary refill takes less than 2 seconds. She is not diaphoretic.  Psychiatric: She has a normal mood and affect. Her behavior is normal. Thought content normal.  Nursing note and vitals reviewed.     Assessment & Plan:  1.  Generalized abdominal pain - Exam appears consistent with GERD or PUD. Will trial her on Zantac 75 mg BID. Basic labs plus lipase and UA.  - CBC with Differential/Platelet - Basic Metabolic Panel - Lipase - POC Urinalysis Dipstick - Follow up with PCP early next week if not improved.  -  Consider gi consult in the future.   Dorothyann Peng, NP

## 2017-12-26 DIAGNOSIS — Z6837 Body mass index (BMI) 37.0-37.9, adult: Secondary | ICD-10-CM | POA: Diagnosis not present

## 2017-12-26 DIAGNOSIS — Z01419 Encounter for gynecological examination (general) (routine) without abnormal findings: Secondary | ICD-10-CM | POA: Diagnosis not present

## 2018-01-12 ENCOUNTER — Encounter: Payer: Self-pay | Admitting: Family Medicine

## 2018-01-13 ENCOUNTER — Ambulatory Visit (INDEPENDENT_AMBULATORY_CARE_PROVIDER_SITE_OTHER): Payer: 59 | Admitting: Family Medicine

## 2018-01-13 ENCOUNTER — Encounter: Payer: Self-pay | Admitting: Family Medicine

## 2018-01-13 VITALS — BP 104/68 | Temp 98.2°F | Ht 64.0 in | Wt 221.2 lb

## 2018-01-13 DIAGNOSIS — R1012 Left upper quadrant pain: Secondary | ICD-10-CM | POA: Diagnosis not present

## 2018-01-13 DIAGNOSIS — L309 Dermatitis, unspecified: Secondary | ICD-10-CM

## 2018-01-13 DIAGNOSIS — R110 Nausea: Secondary | ICD-10-CM | POA: Diagnosis not present

## 2018-01-13 LAB — CBC WITH DIFFERENTIAL/PLATELET
Basophils Absolute: 0.1 10*3/uL (ref 0.0–0.1)
Basophils Relative: 1.1 % (ref 0.0–3.0)
EOS PCT: 3.1 % (ref 0.0–5.0)
Eosinophils Absolute: 0.3 10*3/uL (ref 0.0–0.7)
HCT: 39.5 % (ref 36.0–46.0)
Hemoglobin: 13.3 g/dL (ref 12.0–15.0)
LYMPHS ABS: 2.9 10*3/uL (ref 0.7–4.0)
Lymphocytes Relative: 31.2 % (ref 12.0–46.0)
MCHC: 33.6 g/dL (ref 30.0–36.0)
MCV: 91.8 fl (ref 78.0–100.0)
Monocytes Absolute: 0.7 10*3/uL (ref 0.1–1.0)
Monocytes Relative: 7.6 % (ref 3.0–12.0)
NEUTROS PCT: 57 % (ref 43.0–77.0)
Neutro Abs: 5.3 10*3/uL (ref 1.4–7.7)
Platelets: 251 10*3/uL (ref 150.0–400.0)
RBC: 4.3 Mil/uL (ref 3.87–5.11)
RDW: 12.8 % (ref 11.5–15.5)
WBC: 9.4 10*3/uL (ref 4.0–10.5)

## 2018-01-13 LAB — BASIC METABOLIC PANEL
BUN: 9 mg/dL (ref 6–23)
CO2: 28 meq/L (ref 19–32)
Calcium: 10 mg/dL (ref 8.4–10.5)
Chloride: 102 mEq/L (ref 96–112)
Creatinine, Ser: 0.89 mg/dL (ref 0.40–1.20)
GFR: 75.61 mL/min (ref >60.00)
GLUCOSE: 85 mg/dL (ref 70–99)
POTASSIUM: 4.2 meq/L (ref 3.5–5.1)
SODIUM: 138 meq/L (ref 135–145)

## 2018-01-13 LAB — HEPATIC FUNCTION PANEL
ALT: 24 U/L (ref 0–35)
AST: 20 U/L (ref 0–37)
Albumin: 4.5 g/dL (ref 3.5–5.2)
Alkaline Phosphatase: 66 U/L (ref 39–117)
BILIRUBIN TOTAL: 0.3 mg/dL (ref 0.2–1.2)
Bilirubin, Direct: 0 mg/dL (ref 0.0–0.3)
Total Protein: 7.3 g/dL (ref 6.0–8.3)

## 2018-01-13 LAB — LIPASE: Lipase: 65 U/L — ABNORMAL HIGH (ref 11.0–59.0)

## 2018-01-13 LAB — AMYLASE: Amylase: 39 U/L (ref 27–131)

## 2018-01-13 MED ORDER — OMEPRAZOLE 40 MG PO CPDR: 40.0000 mg | DELAYED_RELEASE_CAPSULE | Freq: Every day | ORAL | 0 refills | Status: DC

## 2018-01-13 MED ORDER — ONDANSETRON HCL 8 MG PO TABS
8.0000 mg | ORAL_TABLET | Freq: Three times a day (TID) | ORAL | 0 refills | Status: DC | PRN
Start: 2018-01-13 — End: 2018-08-02

## 2018-01-13 NOTE — Progress Notes (Signed)
   Subjective:    Patient ID: Diana James, female    DOB: 15-Dec-1980, 37 y.o.   MRN: [id number]  HPI Here for several issues. First she has had almost daily nausea and abdominal pains for the past 3 months. The pains are usually in the LUQ and epigastrium, although she sometimes gets RUQ pain as well. She has frequent heartburn.  No back pain. She seldom vomits. She also has frequent diarrhea. No urinary symptoms. No blood in the stools or urine. No fevers. She has been taking Zantac 75 mg bid but this has not helped. Also for several weeks she has had patches of red itchy skin on the arms and face.   Review of Systems  Constitutional: Negative.   Respiratory: Negative.   Cardiovascular: Negative.   Gastrointestinal: Positive for abdominal pain, diarrhea and nausea. Negative for abdominal distention, anal bleeding, blood in stool, constipation, rectal pain and vomiting.  Genitourinary: Negative.   Skin: Positive for rash.       Objective:   Physical Exam  Constitutional: She appears well-developed and well-nourished.  Eyes: No scleral icterus.  Cardiovascular: Normal rate, regular rhythm, normal heart sounds and intact distal pulses.  Pulmonary/Chest: Effort normal and breath sounds normal. No stridor. No respiratory distress. She has no wheezes. She has no rales.  Abdominal: Soft. Bowel sounds are normal. She exhibits no distension and no mass. There is no rebound and no guarding. No hernia.  Tender in the epigastrium and LUQ   Skin:  Patches of red macular scaly skin in both antecubital areas           Assessment & Plan:  She definitely has an element of GERD, but she likely has gall bladder disease as well. Stop Zantac and begin Omeprazole 40 mg daily. Use Zofran as needed for nausea. Get labs today to include a liver panel, amylase and lipase. Set up an abdominal US for next week. She also has mild eczema and she can use OTC hydrocortisone cream for this.  Alysia Penna, MD

## 2018-01-16 ENCOUNTER — Encounter: Payer: Self-pay | Admitting: Family Medicine

## 2018-01-25 ENCOUNTER — Ambulatory Visit
Admission: RE | Admit: 2018-01-25 | Discharge: 2018-01-25 | Disposition: A | Discharge status: Home / Self Care | Payer: 59 | Source: Ambulatory Visit | Attending: Family Medicine | Admitting: Family Medicine

## 2018-01-25 DIAGNOSIS — K824 Cholesterolosis of gallbladder: Secondary | ICD-10-CM | POA: Diagnosis not present

## 2018-01-25 DIAGNOSIS — R1012 Left upper quadrant pain: Secondary | ICD-10-CM

## 2018-01-26 ENCOUNTER — Encounter: Payer: Self-pay | Admitting: Family Medicine

## 2018-03-21 DIAGNOSIS — Z719 Counseling, unspecified: Secondary | ICD-10-CM | POA: Diagnosis not present

## 2018-04-13 ENCOUNTER — Encounter: Payer: Self-pay | Admitting: Family Medicine

## 2018-04-14 ENCOUNTER — Telehealth: Payer: Self-pay | Admitting: Family Medicine

## 2018-04-14 NOTE — Telephone Encounter (Signed)
Returned call to the pt.  Informed her there is no availability at the [place] location.  Informed her I could find her an appointment at another location or suggested the UC.  Pt opted to go to the UC.  I have scheduled her to see Dr. Maudie Mercury on 04/20/18 to discuss migraine medication so this does not happen again in the future and also as a UC follow up.  Pt seemed very happy to get that appt.  Will forward to Dr. Maudie Mercury as Juluis Rainier.

## 2018-04-14 NOTE — Telephone Encounter (Signed)
Copied from [place] [phone]. Topic: General - Other >> Apr 14, 2018 10:59 AM Yvette Rack wrote: Reason for CRM: pt calling stating that she would like something for a migraine that she has had for two days she has nausea headache sensitive to light she has left a e-mail yesterday  [facility] [place], [place] [phone] (Phone) [phone] (Fax)

## 2018-04-20 ENCOUNTER — Ambulatory Visit: Payer: 59 | Admitting: Family Medicine

## 2018-07-31 ENCOUNTER — Encounter: Payer: Self-pay | Admitting: Nurse Practitioner

## 2018-07-31 ENCOUNTER — Encounter: Payer: Self-pay | Admitting: Family Medicine

## 2018-07-31 ENCOUNTER — Ambulatory Visit: Payer: 59 | Admitting: Family Medicine

## 2018-07-31 VITALS — BP 130/80 | HR 110 | Temp 98.6°F | Wt 219.6 lb

## 2018-07-31 DIAGNOSIS — R1012 Left upper quadrant pain: Secondary | ICD-10-CM

## 2018-07-31 DIAGNOSIS — K5900 Constipation, unspecified: Secondary | ICD-10-CM

## 2018-07-31 DIAGNOSIS — K589 Irritable bowel syndrome without diarrhea: Secondary | ICD-10-CM

## 2018-07-31 LAB — CBC WITH DIFFERENTIAL/PLATELET
Basophils Absolute: 0.1 10*3/uL (ref 0.0–0.1)
Basophils Relative: 0.8 % (ref 0.0–3.0)
Eosinophils Absolute: 0.3 10*3/uL (ref 0.0–0.7)
Eosinophils Relative: 3.1 % (ref 0.0–5.0)
HCT: 41.2 % (ref 36.0–46.0)
Hemoglobin: 13.7 g/dL (ref 12.0–15.0)
Lymphocytes Relative: 23.1 % (ref 12.0–46.0)
Lymphs Abs: 2.5 10*3/uL (ref 0.7–4.0)
MCHC: 33.4 g/dL (ref 30.0–36.0)
MCV: 93.2 fl (ref 78.0–100.0)
Monocytes Absolute: 0.7 10*3/uL (ref 0.1–1.0)
Monocytes Relative: 6.6 % (ref 3.0–12.0)
NEUTROS PCT: 66.4 % (ref 43.0–77.0)
Neutro Abs: 7.2 10*3/uL (ref 1.4–7.7)
Platelets: 261 10*3/uL (ref 150.0–400.0)
RBC: 4.41 Mil/uL (ref 3.87–5.11)
RDW: 13.2 % (ref 11.5–15.5)
WBC: 10.9 10*3/uL — ABNORMAL HIGH (ref 4.0–10.5)

## 2018-07-31 LAB — COMPREHENSIVE METABOLIC PANEL
ALT: 21 U/L (ref 0–35)
AST: 19 U/L (ref 0–37)
Albumin: 4.4 g/dL (ref 3.5–5.2)
Alkaline Phosphatase: 68 U/L (ref 39–117)
BUN: 12 mg/dL (ref 6–23)
CO2: 25 mEq/L (ref 19–32)
Calcium: 9.7 mg/dL (ref 8.4–10.5)
Chloride: 104 mEq/L (ref 96–112)
Creatinine, Ser: 0.77 mg/dL (ref 0.40–1.20)
GFR: 83.84 mL/min (ref >60.00)
Glucose, Bld: 91 mg/dL (ref 70–99)
Potassium: 4.1 mEq/L (ref 3.5–5.1)
Sodium: 137 mEq/L (ref 135–145)
Total Bilirubin: 0.4 mg/dL (ref 0.2–1.2)
Total Protein: 7 g/dL (ref 6.0–8.3)

## 2018-07-31 LAB — LIPASE: Lipase: 80 U/L — ABNORMAL HIGH (ref 11.0–59.0)

## 2018-07-31 LAB — AMYLASE: Amylase: 45 U/L (ref 27–131)

## 2018-07-31 NOTE — Patient Instructions (Addendum)
Keep taking the omeprazole but try to take 30 minutes prior to dinner.   Take pepcid over the counter twice daily as directed. (morning, bedtime)  I will touch base with you when I get bloodwork back.   Try miralax as directed. Increase water intake.

## 2018-07-31 NOTE — Progress Notes (Signed)
Diana James DOB: Aug 26, 1980 Encounter date: 07/31/2018  This is a 38 y.o. female who presents with Chief Complaint  Patient presents with  . Nausea    "bulge" left side directly under the ribs, x 1 week, nausea started the same time, no vomiting, painful to the touch, discomfort when moving    History of present illness:  Always has a lot of stomach trouble. Struggles with nausea intermittently but has been worse recently - thinks it started about 5 days ago. No vomiting.   Bulge upper left abd that when she moves or pushes on it hurts.   Normally has diarrhea, but hasn't had BM in days. Not sure what has changed with bowel habits. Not eating as much because of nausea.   Last BM she is uncertain - maybe 3-5 days ago.   Bulge for 3-5 days. Not noticed it before that. Has had some tenderness in this area but not noted "bulge" before. Bulge does get better with laying down; pain improves as well.   Passing gas, burping.   Pain is about 2/10; more like 5/10 with palpation.   Zofran does help with nausea, but not always taking it completely away. Motrin helps with discomfort.   Not worse or better with eating.   Has been a long time since seeing GI.   Urine is dark, and has odor.   Does have some acid reflux history/issues. If she doesn't take omeprazole she has heartburn all the time and even stools will smell like stomach acid.     Allergies  Allergen Reactions  . Cefdinir Hives  . Penicillins     Hives    Current Meds  Medication Sig  . cetirizine (ZYRTEC) 10 MG tablet Take 10 mg by mouth daily.  Marland Kitchen levonorgestrel (MIRENA) 20 MCG/24HR IUD 1 each by Intrauterine route once.  Marland Kitchen omeprazole (PRILOSEC) 40 MG capsule Take 1 capsule (40 mg total) by mouth daily.  . ondansetron (ZOFRAN) 8 MG tablet Take 1 tablet (8 mg total) by mouth every 8 (eight) hours as needed for nausea or vomiting.  . venlafaxine (EFFEXOR) 75 MG tablet Take 75 mg by mouth daily.    Review of  Systems  Constitutional: Negative for chills, fatigue and fever.  Respiratory: Negative for cough, chest tightness, shortness of breath and wheezing.   Cardiovascular: Negative for chest pain, palpitations and leg swelling.  Gastrointestinal: Positive for abdominal distention, abdominal pain, constipation and nausea. Negative for anal bleeding, blood in stool, rectal pain and vomiting.    Objective:  BP 130/80 (BP Location: Left Arm, Patient Position: Sitting, Cuff Size: Large)   Pulse (!) 110   Temp 98.6 F (37 C) (Oral)   Wt 219 lb 9.6 oz (99.6 kg)   SpO2 97%   BMI 37.69 kg/m   Weight: 219 lb 9.6 oz (99.6 kg)   BP Readings from Last 3 Encounters:  07/31/18 130/80  01/13/18 104/68  11/11/17 110/80   Wt Readings from Last 3 Encounters:  07/31/18 219 lb 9.6 oz (99.6 kg)  01/13/18 221 lb 3.2 oz (100.3 kg)  11/11/17 221 lb (100.2 kg)    Physical Exam Constitutional:      General: She is not in acute distress.    Appearance: She is well-developed.  Cardiovascular:     Rate and Rhythm: Normal rate and regular rhythm.     Heart sounds: Normal heart sounds. No murmur. No friction rub.  Pulmonary:     Effort: Pulmonary effort is normal. No respiratory  distress.     Breath sounds: Normal breath sounds. No wheezing or rales.  Abdominal:     General: Abdomen is flat. Bowel sounds are normal. There is no distension.     Palpations: Abdomen is soft. There is no hepatomegaly, splenomegaly or mass.     Tenderness: There is abdominal tenderness (LUQ). There is no guarding or rebound.     Hernia: No hernia is present.     Comments: Mild epigastric pain. No noted hernia.  Musculoskeletal:     Right lower leg: No edema.     Left lower leg: No edema.  Neurological:     Mental Status: She is alert and oriented to person, place, and time.  Psychiatric:        Behavior: Behavior normal.     Assessment/Plan 1. Left upper quadrant pain If worsening of pain, let us know. Will refer to  GI for evaluation of worsening/difficulty to control acid reflux while working up acute worsening of sx. We are adding pepcid BID to the omeprazole (instructed to take 30 min prior to dinner). Reviewed previously normal abd [place], but due to hx of amylase elevation will recheck this as well.   - CBC with Differential/Platelet; Future - Comprehensive metabolic panel; Future - Amylase - Lipase - EKG 12-Lead - Comprehensive metabolic panel - CBC with Differential/Platelet  2. Constipation: She does not drink much water through day. Instructed to her work on significant increase in water intake. Trial miralax. If no bm in 48 hours let us know ( I will check back in with lab results).     Return if symptoms worsen or fail to improve and pending bloodwork.    Micheline Rough, MD

## 2018-08-02 ENCOUNTER — Encounter: Payer: Self-pay | Admitting: Family Medicine

## 2018-08-02 ENCOUNTER — Other Ambulatory Visit: Payer: Self-pay | Admitting: Family Medicine

## 2018-08-02 MED ORDER — ONDANSETRON HCL 8 MG PO TABS: 8.0000 mg | ORAL_TABLET | Freq: Three times a day (TID) | ORAL | 0 refills | Status: DC | PRN

## 2018-08-02 NOTE — Telephone Encounter (Signed)
Will GI evaluate this further?

## 2018-08-02 NOTE — Telephone Encounter (Signed)
Please advise 

## 2018-08-02 NOTE — Telephone Encounter (Signed)
I sent previous message; please disregard as I have spoken with Diana James myself. LINDSAY: please check back in with her tomorrow or Friday to see how she is feeling.   I spoke with Diana James - we discussed lab note and reviewed labs. She does have GI appt Tuesday. She states that about a couple hours after last appointment she had BM. Then has had about 5 episodes of diarrhea today. No blood in stool. Abd actually feels a little better on the left side where she was tender. Is having some cramping from diarrhea. Stools are a little dark in color and slimy.  No known fevers, but feeling hot/cold today. Is still at work presently.   Nausea is worse. She took zofran this morning which didn't help much. She has not vomited, but feels like she could. Didn't sleep well last night due to nausea.   She did have alfredo for dinner last night.   Encouraged her to avoid dairy, greasy, heavy meals and stick to simple foods like crackers, broth, clear liquids. Zofran TID to help control nausea. Let us know if any worsening of symptoms in meanwhile.

## 2018-08-02 NOTE — Progress Notes (Signed)
Addressed in other note.

## 2018-08-02 NOTE — Telephone Encounter (Signed)
I did not say that lipase was normal. Please review phone note with her from lab results and plan for more urgent GI referral.  She is correct that lipase has elevated and I do feel she needs some additional evaluation.

## 2018-08-02 NOTE — Telephone Encounter (Signed)
Message addressed by phone.

## 2018-08-08 ENCOUNTER — Ambulatory Visit (INDEPENDENT_AMBULATORY_CARE_PROVIDER_SITE_OTHER): Payer: 59 | Admitting: Nurse Practitioner

## 2018-08-08 ENCOUNTER — Encounter: Payer: Self-pay | Admitting: Nurse Practitioner

## 2018-08-08 VITALS — BP 112/72 | HR 108 | Ht 64.0 in | Wt 216.6 lb

## 2018-08-08 DIAGNOSIS — K529 Noninfective gastroenteritis and colitis, unspecified: Secondary | ICD-10-CM | POA: Diagnosis not present

## 2018-08-08 DIAGNOSIS — K625 Hemorrhage of anus and rectum: Secondary | ICD-10-CM

## 2018-08-08 DIAGNOSIS — K219 Gastro-esophageal reflux disease without esophagitis: Secondary | ICD-10-CM

## 2018-08-08 DIAGNOSIS — R110 Nausea: Secondary | ICD-10-CM

## 2018-08-08 MED ORDER — NA SULFATE-K SULFATE-MG SULF 17.5-3.13-1.6 GM/177ML PO SOLN: ORAL | 0 refills | Status: DC

## 2018-08-08 MED ORDER — DICYCLOMINE HCL 20 MG PO TABS: 20.0000 mg | ORAL_TABLET | Freq: Two times a day (BID) | ORAL | 3 refills | Status: DC | PRN

## 2018-08-08 NOTE — Progress Notes (Signed)
Reviewed. I agree with documentation including the assessment and plan.  Leonore Frankson L. Obadiah Dennard, MD, MPH 

## 2018-08-08 NOTE — Progress Notes (Signed)
ASSESSMENT / PLAN:   3. 38 year old female with chronic crampy diarrhea / , now with a few months history of intermittent rectal bleeding.  Overall bleeding sounds scant.  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 -Patient was scheduled for colonoscopy. The risks and benefits of colonoscopy with possible polypectomy were discussed and the patient agrees to proceed.  -Trial of Bentyl 20 mg twice daily as needed.  2. GERD, chronic with pyrosis / ? Globus / nocturnal choking sensation which may be regurgitation.  -We discussed lifestyle changes.  She drinks a lot of caffeine throughout the day.  To start with have asked her to decrease caffeine consumption by one half for now -She has significant nocturnal symptoms.  Recommended elevating head of bed 6 to 8 inches.  If this not possible then obtain wedge pillow. -Even minor weight loss can help reflux symptoms. -Because there has been absolutely no improvement in symptoms with PPI plus H2 blocker will arrange for EGD to make sure nothing is being missed  3. Several month history of nausea / vague LUQ discomfort -LUQ discomfort only related to movement and probably musculoskeletal.  Regarding nausea, has Mirena for birth control. No vomiting. Zofran helps. No weight loss.   Her lipase has been minimally elevated a couple of times recently however.  If EGD unremarkable and  nausea and vague LUQ persists consider CTAP with contrast    HPI:    Chief Complaint:   Multiple GI complaints.    38 year old female previously known to Dr. Deatra Ina who evaluated her in 2015 for diarrhea.  TTG and IgA were normal .  She is referred by PCP Micheline Rough, MD for abdominal pain and IBS.   July 2019 visit with PCP for nausea, upper abdominal pain despite twice daily Zantac-felt to have GERD,  r/o gb disease. Stopped Zantac, added omeprazole 40 mg daily. Added Zofran. Got u/s which noted 4 mm  polyp, no other findings  Late Jan 2019 visit with PCP for bowel changes (constipation),  nausea, LUQ bulging / discomfort.  Added Pepcid twice daily to omeprazole.  Increased fluid intake, recommended MiraLAX for constipation.  Multiple labs drawn, see below.   Data Reviewed:  07/31/2018 Comprehensive metabolic profile unremarkable WBC 10.9, CBC o/w normal.  Lipase 80, it was 65 in mid July 2019  Diarrhea:  Review of records in EMR show the patient has complained of constipation a few times but she says that diarrhea is predominantly her norm. She can't relate the crampy diarrhea to foods but can possibly correlate with anxiety.  She describes having several episodes of diarrhea one day then no bowel movement for several days which is what she describes as being constipation.  Over the last several months she has had some intermittent rectal bleeding.  No fevers, no weight loss.  Nausea / LUQ pain:  LUQ pain only related to movement, likely musculoskeletal.  Has has been mildly elevated recently however. Has Mirena for birth control  GERD:  Frequent heartburn.  At night her throat has a tickle, often wakes up with sensation of choking.  No noticeable improvement with Nexium in the past nor omeprazole plus Pepcid which is her current regimen.  She drinks caffeine throughout the day to stay awake.    Past Medical History:  Diagnosis Date  . Allergic rhinitis   . Anxiety   . Elevated blood pressure 10/22/2014  .  Elevated cholesterol with high triglycerides 10/22/2014  . History of shingles 11/02/2016   diagnosed and treated by Dr Radene Knee  . Migraine   . Obesity   . Palpitations   . Tobacco abuse 09/20/2012     Past Surgical History:  Procedure Laterality Date  . CESAREAN SECTION  2008, 2010   x2  . TONSILLECTOMY  2000   Family History  Problem Relation Age of Onset  . Cancer - Other Father        Throat  . Hypertension Mother   . Hyperlipidemia Mother   . Mental illness Mother         Borderline personality d/o, bipolar, manic depressive  . Celiac disease Mother   . Thyroid disease Mother   . Diabetes Paternal Grandfather   . CAD Neg Hx   . Neuromuscular disorder Neg Hx    Social History   Tobacco Use  . Smoking status: Current Every Day Smoker    Packs/day: 0.50    Years: 15.00    Pack years: 7.50    Types: Cigarettes  . Smokeless tobacco: Never Used  Substance Use Topics  . Alcohol use: Yes    Alcohol/week: 12.0 standard drinks    Types: 12 Standard drinks or equivalent per week    Comment: 1 drink per day  . Drug use: No   Current Outpatient Medications  Medication Sig Dispense Refill  . famotidine (PEPCID) 10 MG tablet Take 10 mg by mouth 2 (two) times daily.    . cetirizine (ZYRTEC) 10 MG tablet Take 10 mg by mouth daily.    Marland Kitchen levonorgestrel (MIRENA) 20 MCG/24HR IUD 1 each by Intrauterine route once.    Marland Kitchen omeprazole (PRILOSEC) 40 MG capsule Take 1 capsule (40 mg total) by mouth daily. 30 capsule 0  . ondansetron (ZOFRAN) 8 MG tablet Take 1 tablet (8 mg total) by mouth every 8 (eight) hours as needed for nausea or vomiting. 90 tablet 0  . venlafaxine (EFFEXOR) 75 MG tablet Take 75 mg by mouth daily.     No current facility-administered medications for this visit.    Allergies  Allergen Reactions  . Cefdinir Hives  . Penicillins     Hives      Review of Systems: Positive for allergy/sinus trouble, anxiety, back pain, breast changes, cough, fatigue, headaches, itching, menstrual pain, night sweats, sleeping problems and urine leakage.  All other systems reviewed and negative except where noted in HPI.   Serum creatinine: 0.77 mg/dL 07/31/18 1142 Estimated creatinine clearance: 108.5 mL/min   Physical Exam:    Wt Readings from Last 3 Encounters:  08/08/18 216 lb 9.6 oz (98.2 kg)  07/31/18 219 lb 9.6 oz (99.6 kg)  01/13/18 221 lb 3.2 oz (100.3 kg)    BP 112/72   Pulse (!) 108   Ht 5\' 4"  (1.626 m)   Wt 216 lb 9.6 oz (98.2 kg)    SpO2 98%   BMI 37.18 kg/m  Constitutional:  Pleasant female in no acute distress. Psychiatric: Normal mood and affect. Behavior is normal. EENT: Pupils normal.  Conjunctivae are normal. No scleral icterus. Neck supple.  Cardiovascular: Normal rate, regular rhythm. No edema Pulmonary/chest: Effort normal and breath sounds normal. No wheezing, rales or rhonchi. Abdominal: Soft, nondistended, nontender. Bowel sounds active throughout. There are no masses palpable. No hepatomegaly. Neurological: Alert and oriented to person place and time. Skin: Skin is warm and dry. No rashes noted.  Tye Savoy, NP  08/08/2018, 10:42 AM  Cc: Micheline Rough  C, MD

## 2018-08-08 NOTE — Patient Instructions (Addendum)
If you are age 39 or older, your body mass index should be between 23-30. Your Body mass index is 37.18 kg/m. If this is out of the aforementioned range listed, please consider follow up with your Primary Care Provider.  If you are age 10 or younger, your body mass index should be between 19-25. Your Body mass index is 37.18 kg/m. If this is out of the aformentioned range listed, please consider follow up with your Primary Care Provider.   You have been scheduled for an endoscopy and colonoscopy. Please follow the written instructions given to you at your visit today. Please pick up your prep supplies at the pharmacy within the next 1-3 days. If you use inhalers (even only as needed), please bring them with you on the day of your procedure. Your physician has requested that you go to www.startemmi.com and enter the access code given to you at your visit today. This web site gives a general overview about your procedure. However, you should still follow specific instructions given to you by our office regarding your preparation for the procedure.  We have sent the following medications to your pharmacy for you to pick up at your convenience: Suprep Bentyl 20 mg  Raise head of bed.  Empty stomach at bedtime.  DECREASE caffeine by half.  Thank you for choosing me and [facility].   Tye Savoy, NP

## 2018-08-09 ENCOUNTER — Encounter: Payer: Self-pay | Admitting: Gastroenterology

## 2018-08-15 ENCOUNTER — Ambulatory Visit (AMBULATORY_SURGERY_CENTER): Payer: 59 | Admitting: Gastroenterology

## 2018-08-15 ENCOUNTER — Encounter: Payer: Self-pay | Admitting: Gastroenterology

## 2018-08-15 VITALS — BP 104/66 | HR 89 | Temp 97.1°F | Resp 11 | Ht 64.0 in | Wt 216.0 lb

## 2018-08-15 DIAGNOSIS — K635 Polyp of colon: Secondary | ICD-10-CM

## 2018-08-15 DIAGNOSIS — D125 Benign neoplasm of sigmoid colon: Secondary | ICD-10-CM

## 2018-08-15 DIAGNOSIS — K210 Gastro-esophageal reflux disease with esophagitis: Secondary | ICD-10-CM | POA: Diagnosis not present

## 2018-08-15 DIAGNOSIS — K2980 Duodenitis without bleeding: Secondary | ICD-10-CM

## 2018-08-15 DIAGNOSIS — K529 Noninfective gastroenteritis and colitis, unspecified: Secondary | ICD-10-CM

## 2018-08-15 DIAGNOSIS — K2970 Gastritis, unspecified, without bleeding: Secondary | ICD-10-CM

## 2018-08-15 DIAGNOSIS — D124 Benign neoplasm of descending colon: Secondary | ICD-10-CM

## 2018-08-15 MED ORDER — SODIUM CHLORIDE 0.9 % IV SOLN: 500.0000 mL | INTRAVENOUS | Status: DC

## 2018-08-15 NOTE — Progress Notes (Signed)
Spontaneous respirations throughout. VSS. Resting comfortably. To PACU on room air. Report to  RN. 

## 2018-08-15 NOTE — Op Note (Addendum)
[facility] Patient Name: Diana James Procedure Date: 08/15/2018 1:01 PM MRN: [id number] Endoscopist: Thornton Park MD, MD Age: 38 Referring MD:  Date of Birth: 1981/06/29 Gender: Female Account #: [id number] Procedure:                Colonoscopy Indications:              Chronic diarrhea. Mother with multiple precancerous                            polyps on screening colonoscopy. Medicines:                See the Anesthesia note for documentation of the                            administered medications Procedure:                Pre-Anesthesia Assessment:                           - Prior to the procedure, a History and Physical                            was performed, and patient medications and                            allergies were reviewed. The patient's tolerance of                            previous anesthesia was also reviewed. The risks                            and benefits of the procedure and the sedation                            options and risks were discussed with the patient.                            All questions were answered, and informed consent                            was obtained. Prior Anticoagulants: The patient has                            taken no previous anticoagulant or antiplatelet                            agents. ASA Grade Assessment: II - A patient with                            mild systemic disease. After reviewing the risks                            and benefits, the patient was deemed in  satisfactory condition to undergo the procedure.                           After obtaining informed consent, the colonoscope                            was passed under direct vision. Throughout the                            procedure, the patient's blood pressure, pulse, and                            oxygen saturations were monitored continuously. The                            Model CF-HQ190L  [id number]) scope was introduced                            through the anus and advanced to the the terminal                            ileum, with identification of the appendiceal                            orifice and IC valve. The colonoscopy was performed                            without difficulty. The patient tolerated the                            procedure well. The quality of the bowel                            preparation was good. The terminal ileum, ileocecal                            valve, appendiceal orifice, and rectum were                            photographed. Scope In: 1:24:16 PM Scope Out: 1:38:12 PM Scope Withdrawal Time: 0 hours 11 minutes 31 seconds  Total Procedure Duration: 0 hours 13 minutes 56 seconds  Findings:                 The perianal and digital rectal examinations were                            normal.                           A 4 mm polyp was found in the descending colon. The                            polyp was sessile. The polyp was removed with a  cold snare. Resection and retrieval were complete.                            Estimated blood loss was minimal.                           A 3 mm polyp was found in the sigmoid colon. The                            polyp was sessile. The polyp was removed with a                            cold snare. Resection and retrieval were complete.                            Estimated blood loss was minimal.                           The colon (entire examined portion) appeared                            normal. Biopsies were taken with a cold forceps for                            histology. Estimated blood loss was minimal.                           The exam was otherwise without abnormality on                            direct and retroflexion views. Complications:            No immediate complications. Estimated blood loss:                            Minimal. Estimated  Blood Loss:     Estimated blood loss was minimal. Impression:               - One 4 mm polyp in the descending colon, removed                            with a cold snare. Resected and retrieved.                           - One 3 mm polyp in the sigmoid colon, removed with                            a cold snare. Resected and retrieved.                           - The entire examined colon is normal. Biopsied.                           - The examination was otherwise normal on direct  and retroflexion views. Recommendation:           - Patient has a contact number available for                            emergencies. The signs and symptoms of potential                            delayed complications were discussed with the                            patient. Return to normal activities tomorrow.                            Written discharge instructions were provided to the                            patient.                           - Resume regular diet.                           - Continue present medications.                           - Await pathology results.                           - Repeat colonoscopy date to be determined after                            pending pathology results are reviewed for                            surveillance. If at least one or two polyps is                            adenomatous, plan surveillance colonoscopy in five                            year.                           - Follow-up in the office in 1-2 months. Thornton Park MD, MD 08/15/2018 1:56:31 PM This report has been signed electronically.

## 2018-08-15 NOTE — Op Note (Signed)
[place] Patient Name: Diana James Procedure Date: 08/15/2018 1:02 PM MRN: [id number] Endoscopist: Thornton Park MD, MD Age: 38 Referring MD:  Date of Birth: [birth year] Gender: Female Account #: [id number] Procedure:                Upper GI endoscopy Indications:              Generalized abdominal pain, Diarrhea, Nausea Medicines:                See the Anesthesia note for documentation of the                            administered medications Procedure:                Pre-Anesthesia Assessment:                           - Prior to the procedure, a History and Physical                            was performed, and patient medications and                            allergies were reviewed. The patient's tolerance of                            previous anesthesia was also reviewed. The risks                            and benefits of the procedure and the sedation                            options and risks were discussed with the patient.                            All questions were answered, and informed consent                            was obtained. Prior Anticoagulants: The patient has                            taken no previous anticoagulant or antiplatelet                            agents. ASA Grade Assessment: II - A patient with                            mild systemic disease. After reviewing the risks                            and benefits, the patient was deemed in                            satisfactory condition to undergo the procedure.  After obtaining informed consent, the endoscope was                            passed under direct vision. Throughout the                            procedure, the patient's blood pressure, pulse, and                            oxygen saturations were monitored continuously. The                            Model GIF-HQ190 [id number]) scope was introduced                            through  the mouth, and advanced to the third part                            of duodenum. The upper GI endoscopy was                            accomplished without difficulty. The patient                            tolerated the procedure well. Scope In: Scope Out: Findings:                 LA Grade A (one or more mucosal breaks less than 5                            mm, not extending between tops of 2 mucosal folds)                            esophagitis with no bleeding was found. Biopsies                            were taken with a cold forceps for histology.                            Estimated blood loss was minimal.                           Diffuse mild inflammation was found in the gastric                            body. Biopsies were taken with a cold forceps for                            histology. Estimated blood loss was minimal.                           Diffuse mildly erythematous mucosa without active  bleeding and with no stigmata of bleeding was found                            in the duodenal bulb. Biopsies were taken with a                            cold forceps for histology. Estimated blood loss                            was minimal.                           The exam was otherwise without abnormality. Complications:            No immediate complications. Estimated blood loss:                            Minimal. Estimated Blood Loss:     Estimated blood loss was minimal. Impression:               - LA Grade A reflux esophagitis. Biopsied.                           - Mild gastritis. Biopsied.                           - Mild erythematous duodenopathy. Biopsied.                           - The examination was otherwise normal. Recommendation:           - Patient has a contact number available for                            emergencies. The signs and symptoms of potential                            delayed complications were discussed with the                             patient. Return to normal activities tomorrow.                            Written discharge instructions were provided to the                            patient.                           - Resume regular diet today.                           - Continue present medications.                           - Await pathology results.                           -  No repeat upper endoscopy planned at this time.                           - Return to GI office in 1-2 months. Thornton Park MD, MD 08/15/2018 1:51:57 PM This report has been signed electronically.

## 2018-08-15 NOTE — Progress Notes (Signed)
Pt's states no medical or surgical changes since previsit or office visit. 

## 2018-08-15 NOTE — Patient Instructions (Signed)
YOU HAD AN ENDOSCOPIC PROCEDURE TODAY AT [facility]:   Refer to the procedure report that was given to you for any specific questions about what was found during the examination.  If the procedure report does not answer your questions, please call your gastroenterologist to clarify.  If you requested that your care partner not be given the details of your procedure findings, then the procedure report has been included in a sealed envelope for you to review at your convenience later.  YOU SHOULD EXPECT: Some feelings of bloating in the abdomen. Passage of more gas than usual.  Walking can help get rid of the air that was put into your GI tract during the procedure and reduce the bloating. If you had a lower endoscopy (such as a colonoscopy or flexible sigmoidoscopy) you may notice spotting of blood in your stool or on the toilet paper. If you underwent a bowel prep for your procedure, you may not have a normal bowel movement for a few days.  Please Note:  You might notice some irritation and congestion in your nose or some drainage.  This is from the oxygen used during your procedure.  There is no need for concern and it should clear up in a day or so.  SYMPTOMS TO REPORT IMMEDIATELY:   Following lower endoscopy (colonoscopy or flexible sigmoidoscopy):  Excessive amounts of blood in the stool  Significant tenderness or worsening of abdominal pains  Swelling of the abdomen that is new, acute  Fever of 100F or higher   Following upper endoscopy (EGD)  Vomiting of blood or coffee ground material  New chest pain or pain under the shoulder blades  Painful or persistently difficult swallowing  New shortness of breath  Fever of 100F or higher  Black, tarry-looking stools  For urgent or emergent issues, a gastroenterologist can be reached at any hour by calling [phone].   DIET:  We do recommend a small meal at first, but then you may proceed to your regular diet.  Drink  plenty of fluids but you should avoid alcoholic beverages for 24 hours.  ACTIVITY:  You should plan to take it easy for the rest of today and you should NOT DRIVE or use heavy machinery until tomorrow (because of the sedation medicines used during the test).    FOLLOW UP: Our staff will call the number listed on your records the next business day following your procedure to check on you and address any questions or concerns that you may have regarding the information given to you following your procedure. If we do not reach you, we will leave a message.  However, if you are feeling well and you are not experiencing any problems, there is no need to return our call.  We will assume that you have returned to your regular daily activities without incident.  If any biopsies were taken you will be contacted by phone or by letter within the next 1-3 weeks.  Please call us at [phone] if you have not heard about the biopsies in 3 weeks.   Await for biopsy results Polyps (handout given) Follow up in office visit in 1-2 months  SIGNATURES/CONFIDENTIALITY: You and/or your care partner have signed paperwork which will be entered into your electronic medical record.  These signatures attest to the fact that that the information above on your After Visit Summary has been reviewed and is understood.  Full responsibility of the confidentiality of this discharge information lies with you  and/or your care-partner. 

## 2018-08-16 ENCOUNTER — Telehealth: Payer: Self-pay

## 2018-08-16 NOTE — Telephone Encounter (Signed)
  Follow up Call-  Call back number 08/15/2018  Post procedure Call Back phone  # [phone]  Permission to leave phone message Yes  Some recent data might be hidden     Patient questions:  Do you have a fever, pain , or abdominal swelling? No. Pain Score  0 *  Have you tolerated food without any problems? Yes.    Have you been able to return to your normal activities? Yes.    Do you have any questions about your discharge instructions: Diet   No. Medications  No. Follow up visit  No.  Do you have questions or concerns about your Care? No.  Actions: * If pain score is 4 or above: No action needed, pain <4.

## 2018-08-16 NOTE — Telephone Encounter (Signed)
No answer, left message to call back later today, B.Jailan Trimm RN. 

## 2018-08-23 ENCOUNTER — Encounter: Payer: Self-pay | Admitting: Gastroenterology

## 2018-11-17 ENCOUNTER — Ambulatory Visit (INDEPENDENT_AMBULATORY_CARE_PROVIDER_SITE_OTHER): Payer: 59 | Admitting: Family Medicine

## 2018-11-17 ENCOUNTER — Other Ambulatory Visit: Payer: Self-pay

## 2018-11-17 DIAGNOSIS — R110 Nausea: Secondary | ICD-10-CM

## 2018-11-17 DIAGNOSIS — L255 Unspecified contact dermatitis due to plants, except food: Secondary | ICD-10-CM | POA: Diagnosis not present

## 2018-11-17 MED ORDER — PREDNISONE 20 MG PO TABS: ORAL_TABLET | ORAL | 0 refills | Status: DC

## 2018-11-17 MED ORDER — TRIAMCINOLONE ACETONIDE 0.1 % EX CREA: 1.0000 "application " | TOPICAL_CREAM | Freq: Two times a day (BID) | CUTANEOUS | 0 refills | Status: DC

## 2018-11-17 MED ORDER — ONDANSETRON HCL 8 MG PO TABS: 8.0000 mg | ORAL_TABLET | Freq: Three times a day (TID) | ORAL | 0 refills | Status: DC | PRN

## 2018-11-17 NOTE — Progress Notes (Signed)
Virtual Visit via Video Note  I connected with Diana James on 11/17/18 at  2:30 PM EDT by a video enabled telemedicine application and verified that I am speaking with the correct person using two identifiers.  Location patient: home Location provider:work or home office Persons participating in the virtual visit: patient, provider  I discussed the limitations of evaluation and management by telemedicine and the availability of in person appointments. The patient expressed understanding and agreed to proceed.   Diana James DOB: [birth year] Encounter date: 11/17/2018  This is a 38 y.o. female who presents with No chief complaint on file.   History of present illness:  HPI  Day 3 of rash, hives. On stomach, back, legs. Can't sleep because she is itching so badly. Has been trying to pinpoint cause, but nothing is new.   Nothing has helped - has done benadryl, hydroxyzine, witch hazel, benadryl cream, cortisone cream and not getting any relief.   Researched TIDE detergent; only kind she has tolerated in past; and they changed an ingredient? She wonders if this is culprit.   Allergies  Allergen Reactions  . Cefdinir Hives  . Penicillins     Hives    No outpatient medications have been marked as taking for the 11/17/18 encounter (Office Visit) with Caren Macadam, MD.   Current Facility-Administered Medications for the 11/17/18 encounter (Office Visit) with Caren Macadam, MD  Medication  . 0.9 %  sodium chloride infusion    Review of Systems  Constitutional: Negative for chills, fatigue and fever.  Respiratory: Negative for cough, chest tightness, shortness of breath and wheezing.   Cardiovascular: Negative for chest pain, palpitations and leg swelling.  Gastrointestinal: Abdominal pain: pain much better on current medications. Huge improvement with bentyl. would like zofran for prn use.  Skin: Positive for rash. Negative for color change.    Objective:  There  were no vitals taken for this visit.      BP Readings from Last 3 Encounters:  08/15/18 104/66  08/08/18 112/72  07/31/18 130/80   Wt Readings from Last 3 Encounters:  08/15/18 216 lb (98 kg)  08/08/18 216 lb 9.6 oz (98.2 kg)  07/31/18 219 lb 9.6 oz (99.6 kg)    EXAM:  GENERAL: alert, oriented, appears well and in no acute distress  HEENT: atraumatic, conjunctiva clear, no obvious abnormalities on inspection of external nose and ears  NECK: normal movements of the head and neck  LUNGS: on inspection no signs of respiratory distress, breathing rate appears normal, no obvious gross SOB, gasping or wheezing  CV: no obvious cyanosis  MS: moves all visible extremities without noticeable abnormality  PSYCH/NEURO: pleasant and cooperative, no obvious depression or anxiety, speech and thought processing grossly intact  SKIN: There is early vesicular-erythematous-papular rash in linear array on arms, legs, abdomen. There are some erythematous papules on hand as well. Light surrounding erythema.  Assessment/Plan  1. Plant dermatitis Uncertain if this is plant dermatitis or other contact dermatitis. It is over significant are of body. We will try prednisone and topical triamcinolone; continue PO benadryl. Skin tips for rashes: 1. Avoid hot water as this will cause rash to worsen/irritate skin/increase itching. 2. Avoid prolonged water exposure as this increases skin breakdown (take showers instead of baths or limit bathing when possible). 3. Hulan Fray is a good, gentle soap to use for showering. 4. Apply lotion/cream right after shower. Eucerin/aquaphor are good lotions that generally will not irritate skin. Fragrance free products are best (avoid '  unscented' products as these may have chemicals to remove natural fragrance).    Call if worsening or if not resolved after prednisone. Discussed new medication(s) today with patient. Discussed potential side effects and patient verbalized  understanding.  OK to shorten taper if recovering quickly. Wash dogs, shoes, clothes and extra rinse clothes.Dove soap recommended.  2. Nausea without vomiting refilled zofran.     I discussed the assessment and treatment plan with the patient. The patient was provided an opportunity to ask questions and all were answered. The patient agreed with the plan and demonstrated an understanding of the instructions.   The patient was advised to call back or seek an in-person evaluation if the symptoms worsen or if the condition fails to improve as anticipated.  I provided  15 minutes of non-face-to-face time during this encounter.   Micheline Rough, MD

## 2019-01-18 ENCOUNTER — Other Ambulatory Visit: Payer: Self-pay | Admitting: Internal Medicine

## 2019-01-18 ENCOUNTER — Telehealth: Payer: Self-pay | Admitting: *Deleted

## 2019-01-18 ENCOUNTER — Other Ambulatory Visit: Payer: Self-pay

## 2019-01-18 ENCOUNTER — Ambulatory Visit (INDEPENDENT_AMBULATORY_CARE_PROVIDER_SITE_OTHER): Payer: 59 | Admitting: Family Medicine

## 2019-01-18 DIAGNOSIS — R509 Fever, unspecified: Secondary | ICD-10-CM

## 2019-01-18 DIAGNOSIS — R059 Cough, unspecified: Secondary | ICD-10-CM

## 2019-01-18 DIAGNOSIS — Z20828 Contact with and (suspected) exposure to other viral communicable diseases: Secondary | ICD-10-CM

## 2019-01-18 DIAGNOSIS — R0981 Nasal congestion: Secondary | ICD-10-CM | POA: Diagnosis not present

## 2019-01-18 DIAGNOSIS — Z20822 Contact with and (suspected) exposure to covid-19: Secondary | ICD-10-CM

## 2019-01-18 DIAGNOSIS — R05 Cough: Secondary | ICD-10-CM | POA: Diagnosis not present

## 2019-01-18 NOTE — Telephone Encounter (Signed)
-----   Message from [name], DO sent at 01/18/2019  2:08 PM EDT ----- -next Tuesday with Dr. Maudie Mercury

## 2019-01-18 NOTE — Telephone Encounter (Signed)
Appt scheduled for 7/21 at 10am.

## 2019-01-18 NOTE — Progress Notes (Signed)
Virtual Visit via Video Note  I connected with Diana James  on 01/18/19 at  1:20 PM EDT by a video enabled telemedicine application and verified that I am speaking with the correct person using two identifiers.  Location patient: home Location provider:work or home office Persons participating in the virtual visit: patient, provider  I discussed the limitations of evaluation and management by telemedicine and the availability of in person appointments. The patient expressed understanding and agreed to proceed.   HPI:  Acute visit for feeling sick: -started 3-4 days ago -symptoms include: sore throat, sinus congestion, cough, fever today, HA, body aches, diarrhea -denies: vomiting, SOB, inability to eat or drink, inability to get out of bed -husband is sick as well - waiting on COVID19 results, other family members she has been around also with symptoms and they are awaiting results -husband works outside the home, they were around family members 1.5 weeks ago without masks or social distancing and they got sick a few days later   ROS: See pertinent positives and negatives per HPI.  Past Medical History:  Diagnosis Date  . Allergic rhinitis   . Anxiety   . Elevated blood pressure 10/22/2014  . Elevated cholesterol with high triglycerides 10/22/2014  . History of shingles 11/02/2016   diagnosed and treated by Dr Radene Knee  . Migraine   . Obesity   . Palpitations   . Tobacco abuse 09/20/2012    Past Surgical History:  Procedure Laterality Date  . CESAREAN SECTION  2008, 2010   x2  . TONSILLECTOMY  2000    Family History  Problem Relation Age of Onset  . Cancer - Other Father        Throat  . Hypertension Mother   . Hyperlipidemia Mother   . Mental illness Mother        Borderline personality d/o, bipolar, manic depressive  . Celiac disease Mother   . Thyroid disease Mother   . Diabetes Paternal Grandfather   . CAD Neg Hx   . Neuromuscular disorder Neg Hx     SOCIAL HX: see  hpi   Current Outpatient Medications:  .  cetirizine (ZYRTEC) 10 MG tablet, Take 10 mg by mouth daily., Disp: , Rfl:  .  dicyclomine (BENTYL) 20 MG tablet, Take 1 tablet (20 mg total) by mouth 2 (two) times daily as needed for spasms. (Patient not taking: Reported on 08/15/2018), Disp: 60 tablet, Rfl: 3 .  famotidine (PEPCID) 10 MG tablet, Take 10 mg by mouth 2 (two) times daily., Disp: , Rfl:  .  levonorgestrel (MIRENA) 20 MCG/24HR IUD, 1 each by Intrauterine route once., Disp: , Rfl:  .  omeprazole (PRILOSEC) 40 MG capsule, Take 1 capsule (40 mg total) by mouth daily., Disp: 30 capsule, Rfl: 0 .  ondansetron (ZOFRAN) 8 MG tablet, Take 1 tablet (8 mg total) by mouth every 8 (eight) hours as needed for nausea or vomiting., Disp: 90 tablet, Rfl: 0 .  predniSONE (DELTASONE) 20 MG tablet, Take 3 tabs daily x 3 days, then 2 tabs daily x 3 days, then 1 tab daily x 3 days, then 0.5 tab daily x 2 days, Disp: 19 tablet, Rfl: 0 .  triamcinolone cream (KENALOG) 0.1 %, Apply 1 application topically 2 (two) times daily., Disp: 30 g, Rfl: 0 .  venlafaxine (EFFEXOR) 75 MG tablet, Take 75 mg by mouth daily., Disp: , Rfl:   Current Facility-Administered Medications:  .  0.9 %  sodium chloride infusion, 500 mL, Intravenous, Continuous, [place], [place],  MD  EXAMTonette James per patient if applicable:  GENERAL: alert, oriented, appears well and in no acute distress  HEENT: atraumatic, conjunttiva clear, no obvious abnormalities on inspection of external nose and ears, moist mucus membranes  NECK: normal movements of the head and neck  LUNGS: on inspection no signs of respiratory distress, breathing rate appears normal, no obvious gross SOB, gasping or wheezing  CV: no obvious cyanosis  MS: moves all visible extremities without noticeable abnormality  PSYCH/NEURO: pleasant and cooperative, no obvious depression or anxiety, speech and thought processing grossly intact  ASSESSMENT AND PLAN:  Discussed  the following assessment and plan:  Cough - Plan: Novel Coronavirus, NAA ([facility]),   Fever, unspecified fever cause - Plan: Novel Coronavirus, NAA ([facility]),  Complaint of nasal congestion - Plan: Novel Coronavirus, NAA ([facility]),   -we discussed possible serious and likely etiologies, workup and treatment, treatment risks and return precautions, suspect viral illness or COVID19 likely vs other. -after this discussion, Diana James opted for COVID19 testing, home isolation per CDC - feels safe, symptomatic care, return precuations -follow up advised early next week -of course, we advised Diana James  to return or notify a doctor immediately if symptoms worsen or persist or new concerns arise.    I discussed the assessment and treatment plan with the patient. The patient was provided an opportunity to ask questions and all were answered. The patient agreed with the plan and demonstrated an understanding of the instructions.    Diana Kern, DO   Patient Instructions  Follow up: next Tuesday with Dr. Maudie Mercury    Self Isolation/Home Quarantine: -see the CDC site for information:   [email or web address].za.html   -STAY HOME except for to seek medical care -stay in your own room away from others in your house and use a separate bathroom if possible -Wash hands frequently, wear a mask if you leave your room and interact as little as possible with others -seek medical care immediately if worsening - call our office for a visit or call ahead if going elsewhere to an urgent care  -seek emergency care if very sick or severe symptoms - call 911 -isolate for at least 10 days from the onset of symptoms PLUS 3 days of no fever PLUS 3 days of improving symptoms    Novel Coronavirus Testing: I sent an order for coronavirus testing. No Appointment is needed.  Testing Sites:    [place] Location:                            [place], [place] (old [facility]) Hours:                                 8a-3:45p, M-F  [facility] Location:                           [address], [place], [place] [address]  [place] ([place]) Hours:                                 8a-3:45p, M-F  [facility] Location:                            [place], [place] (across from [place]) Hours:                                 8a-3:45p, M-F  Positive test. These tests are not 100% perfect, but if you tested positive for COVID-19, this confirms that you have contracted the SARS-CoV-2 virus. STAY HOME to complete full Quarantine per CDC guidelines.  Negative test. These tests are not 100% perfect but if you tested negative for COVID-19, this indicates that you may not have contracted the SARS-CoV-2 virus. Follow your doctor's recommendations and the CDC guidelines.

## 2019-01-18 NOTE — Patient Instructions (Signed)
Follow up: next Tuesday with Dr. Maudie Mercury    Self Isolation/Home Quarantine: -see the CDC site for information:   RunningShows.co.za.html   -STAY HOME except for to seek medical care -stay in your own room away from others in your house and use a separate bathroom if possible -Wash hands frequently, wear a mask if you leave your room and interact as little as possible with others -seek medical care immediately if worsening - call our office for a visit or call ahead if going elsewhere to an urgent care  -seek emergency care if very sick or severe symptoms - call 911 -isolate for at least 10 days from the onset of symptoms PLUS 3 days of no fever PLUS 3 days of improving symptoms    Novel Coronavirus Testing: I sent an order for coronavirus testing. No Appointment is needed.  Testing Sites:    [facility] Location:                            [address] [place] (old [facility]) Hours:                                 8a-3:45p, M-F  [facility] Location:                           [address], [place], [place] [address]                                                              [place] ([place]) Hours:                                 8a-3:45p, M-F  [facility] Location:                            [place], [place] (across from [place]) Hours:                                 8a-3:45p, M-F  Positive test. These tests are not 100% perfect, but if you tested positive for COVID-19, this confirms that you have contracted the SARS-CoV-2 virus. STAY HOME to complete full Quarantine per CDC guidelines.  Negative test. These tests are not 100% perfect but if you tested negative for COVID-19, this indicates that you may not have contracted the SARS-CoV-2 virus. Follow your doctor's recommendations and the CDC  guidelines.

## 2019-01-23 ENCOUNTER — Encounter: Payer: Self-pay | Admitting: Family Medicine

## 2019-01-23 ENCOUNTER — Ambulatory Visit (INDEPENDENT_AMBULATORY_CARE_PROVIDER_SITE_OTHER): Payer: 59 | Admitting: Family Medicine

## 2019-01-23 ENCOUNTER — Telehealth: Payer: Self-pay | Admitting: *Deleted

## 2019-01-23 ENCOUNTER — Other Ambulatory Visit: Payer: Self-pay

## 2019-01-23 DIAGNOSIS — H9201 Otalgia, right ear: Secondary | ICD-10-CM | POA: Diagnosis not present

## 2019-01-23 DIAGNOSIS — R059 Cough, unspecified: Secondary | ICD-10-CM

## 2019-01-23 DIAGNOSIS — R05 Cough: Secondary | ICD-10-CM

## 2019-01-23 DIAGNOSIS — Z20828 Contact with and (suspected) exposure to other viral communicable diseases: Secondary | ICD-10-CM

## 2019-01-23 DIAGNOSIS — J989 Respiratory disorder, unspecified: Secondary | ICD-10-CM | POA: Diagnosis not present

## 2019-01-23 LAB — NOVEL CORONAVIRUS, NAA: SARS-CoV-2, NAA: NOT DETECTED

## 2019-01-23 MED ORDER — AZITHROMYCIN 250 MG PO TABS: ORAL_TABLET | ORAL | 0 refills | Status: DC

## 2019-01-23 MED ORDER — ALBUTEROL SULFATE HFA 108 (90 BASE) MCG/ACT IN AERS: 2.0000 | INHALATION_SPRAY | Freq: Four times a day (QID) | RESPIRATORY_TRACT | 0 refills | Status: DC | PRN

## 2019-01-23 MED ORDER — BENZONATATE 100 MG PO CAPS: 100.0000 mg | ORAL_CAPSULE | Freq: Two times a day (BID) | ORAL | 0 refills | Status: DC | PRN

## 2019-01-23 NOTE — Telephone Encounter (Signed)
I called the pt and informed her the work note was completed and sent via [place].  Appt scheduled for 7/28 at 12pm.

## 2019-01-23 NOTE — Progress Notes (Signed)
Virtual Visit via Video Note  I connected with Diana James  on 01/23/19 at 10:00 AM EDT by a video enabled telemedicine application and verified that I am speaking with the correct person using two identifiers.  Location patient: home Location provider:work or home office Persons participating in the virtual visit: patient, provider  I discussed the limitations of evaluation and management by telemedicine and the availability of in person appointments. The patient expressed understanding and agreed to proceed.   HPI:  Follow up cough and COVID like illness: -started around 01/15/19; see prior notes -symptoms of sore throat, sinus congestion, cough, fever, HA, bodyaches and diarrhea initially -today reports: "doing a little better", fever has resolved for several days, but R ear is really hurting and feels really full, has history of OM -due to allergies, she has taken azithromycin for this -she also has a bad cough and some tightness in upper chest and mild SOB for this -has tried sudafed and musinex - has not helped the ear  -brother and mother and husband all had negative covid19 tests -denies: CP, vomiting, remaining diarrhea, fevers -COVID19 test pending, ordered 01/18/19   ROS: See pertinent positives and negatives per HPI.  Past Medical History:  Diagnosis Date  . Allergic rhinitis   . Anxiety   . Elevated blood pressure 10/22/2014  . Elevated cholesterol with high triglycerides 10/22/2014  . History of shingles 11/02/2016   diagnosed and treated by Dr Radene Knee  . Migraine   . Obesity   . Palpitations   . Tobacco abuse 09/20/2012    Past Surgical History:  Procedure Laterality Date  . CESAREAN SECTION  2008, 2010   x2  . TONSILLECTOMY  2000    Family History  Problem Relation Age of Onset  . Cancer - Other Father        Throat  . Hypertension Mother   . Hyperlipidemia Mother   . Mental illness Mother        Borderline personality d/o, bipolar, manic depressive  .  Celiac disease Mother   . Thyroid disease Mother   . Diabetes Paternal Grandfather   . CAD Neg Hx   . Neuromuscular disorder Neg Hx     SOCIAL HX: see HPI   Current Outpatient Medications:  .  cetirizine (ZYRTEC) 10 MG tablet, Take 10 mg by mouth daily., Disp: , Rfl:  .  dicyclomine (BENTYL) 20 MG tablet, Take 1 tablet (20 mg total) by mouth 2 (two) times daily as needed for spasms., Disp: 60 tablet, Rfl: 3 .  famotidine (PEPCID) 10 MG tablet, Take 10 mg by mouth 2 (two) times daily., Disp: , Rfl:  .  levonorgestrel (MIRENA) 20 MCG/24HR IUD, 1 each by Intrauterine route once., Disp: , Rfl:  .  omeprazole (PRILOSEC) 40 MG capsule, Take 1 capsule (40 mg total) by mouth daily., Disp: 30 capsule, Rfl: 0 .  ondansetron (ZOFRAN) 8 MG tablet, Take 1 tablet (8 mg total) by mouth every 8 (eight) hours as needed for nausea or vomiting., Disp: 90 tablet, Rfl: 0 .  triamcinolone cream (KENALOG) 0.1 %, Apply 1 application topically 2 (two) times daily., Disp: 30 g, Rfl: 0 .  venlafaxine (EFFEXOR) 75 MG tablet, Take 75 mg by mouth daily., Disp: , Rfl:  .  albuterol (VENTOLIN HFA) 108 (90 Base) MCG/ACT inhaler, Inhale 2 puffs into the lungs every 6 (six) hours as needed., Disp: 8.5 g, Rfl: 0 .  azithromycin (ZITHROMAX) 250 MG tablet, 2 tabs day 1, then one tab daily,  Disp: 6 tablet, Rfl: 0 .  benzonatate (TESSALON) 100 MG capsule, Take 1 capsule (100 mg total) by mouth 2 (two) times daily as needed for cough., Disp: 20 capsule, Rfl: 0  Current Facility-Administered Medications:  .  0.9 %  sodium chloride infusion, 500 mL, Intravenous, Continuous, Beavers, Joelene Millin, MD  EXAM:  VITALS per patient if applicable:  GENERAL: alert, oriented, appears well and in no acute distress  HEENT: atraumatic, conjunttiva clear, no obvious abnormalities on inspection of external nose and ears  NECK: normal movements of the head and neck  LUNGS: on inspection no signs of respiratory distress, breathing rate  appears normal, no obvious gross SOB, gasping or wheezing  CV: no obvious cyanosis  MS: moves all visible extremities without noticeable abnormality  PSYCH/NEURO: pleasant and cooperative, no obvious depression or anxiety, speech and thought processing grossly intact  ASSESSMENT AND PLAN:  Discussed the following assessment and plan:  Respiratory illness  Right ear pain  Cough   -discussed potential etiologies, COVID19 testing still pending, but may be other resp illness given family members testing with similar symptoms was neg -she tends to get OM with resp illness - discussed options for eval and treatment, risks - she opted to treat with azithro -alb for mild SOB/bronchitis -tessalon for cough -she requests work slip with details as noted below, wants to work remotely for now -follow up in 2-7 days and as needed   I discussed the assessment and treatment plan with the patient. The patient was provided an opportunity to ask questions and all were answered. The patient agreed with the plan and demonstrated an understanding of the instructions.   The patient was advised to call back or seek an in-person evaluation if the symptoms worsen or if the condition fails to improve as anticipated.   Follow up instructions: Advised assistant Wendie Simmer to help patient arrange the following: -follow up thurs or tues with Dr. Maudie Mercury -work slip in pt instructions [name], DO

## 2019-01-23 NOTE — Patient Instructions (Signed)
-  take the Azithromycin per instructions  -Tessalon per instructions as needed for cough  -albuterol as needed per instructions if wheezing or tight  -follow up Thursday or Tuesday or sooner as needed  I hope you are feeling better soon! Seek care promptly if your symptoms worsen, new concerns arise or you are not improving with treatment.   WORK SLIP:  Please excuse patient Diana James, DOB 08-24-1980, from work according to the [employer] guidelines for a COVID like illness. We advise 10 days minimum from the onset of symptoms (01/15/2019) PLUS 3 days of no fever PLUS 3 days of felling better.  She may work remotely from home in self isolation if she if feeling better and wishes to do so.  Sincerely: E-signature: Dr. Colin Benton, [place] Ph: [phone]

## 2019-01-23 NOTE — Telephone Encounter (Signed)
-----   Message from [name], DO sent at 01/23/2019 10:24 AM EDT ----- -follow up thurs or tues with Dr. Bufford Buttner slip in pt instructions

## 2019-01-24 ENCOUNTER — Encounter: Payer: Self-pay | Admitting: Family Medicine

## 2019-01-24 NOTE — Telephone Encounter (Signed)
Didn't see this until after end of day. Thought [facility] might be able to do follow up Thursday with her or if she feels that Daniyla needs to be seen in person let her know.

## 2019-01-25 ENCOUNTER — Other Ambulatory Visit: Payer: Self-pay

## 2019-01-25 ENCOUNTER — Ambulatory Visit (INDEPENDENT_AMBULATORY_CARE_PROVIDER_SITE_OTHER): Payer: 59 | Admitting: Family Medicine

## 2019-01-25 ENCOUNTER — Encounter: Payer: Self-pay | Admitting: Family Medicine

## 2019-01-25 DIAGNOSIS — R519 Headache, unspecified: Secondary | ICD-10-CM

## 2019-01-25 DIAGNOSIS — R05 Cough: Secondary | ICD-10-CM | POA: Diagnosis not present

## 2019-01-25 DIAGNOSIS — R51 Headache: Secondary | ICD-10-CM | POA: Diagnosis not present

## 2019-01-25 DIAGNOSIS — H9201 Otalgia, right ear: Secondary | ICD-10-CM

## 2019-01-25 DIAGNOSIS — R059 Cough, unspecified: Secondary | ICD-10-CM

## 2019-01-25 NOTE — Progress Notes (Signed)
Virtual Visit via Video Note  I connected with Diana James  on 01/25/19 at 12:00 PM EDT by a video enabled telemedicine application and verified that I am speaking with the correct person using two identifiers.  Location patient: home Location provider:work or home office Persons participating in the virtual visit: patient, provider  I discussed the limitations of evaluation and management by telemedicine and the availability of in person appointments. The patient expressed understanding and agreed to proceed.   HPI:  Acute visit for R ear pain/bad headache: -symptoms started 7/13 with sinus issues, upper resp symptoms, HA, body aches and diarrhea -she was tested for COVID19 - negative, started abx for possible OM 7/21 -some symptoms better, but reports mod-severe headache persistent on R side of head/ear and now around R eye - this is worse when she coughs -still with cough, nasal congestion and other symptoms improving -family members had similar symptoms and also tested neg for COVID19 -no SOB, fevers, NVD, neuro deficits -denies worst headache of life, but reports pain is severe at times, has hx migraines, but she reports this is differnet  ROS: See pertinent positives and negatives per HPI.  Past Medical History:  Diagnosis Date  . Allergic rhinitis   . Anxiety   . Elevated blood pressure 10/22/2014  . Elevated cholesterol with high triglycerides 10/22/2014  . History of shingles 11/02/2016   diagnosed and treated by Dr Radene Knee  . Migraine   . Obesity   . Palpitations   . Tobacco abuse 09/20/2012    Past Surgical History:  Procedure Laterality Date  . CESAREAN SECTION  2008, 2010   x2  . TONSILLECTOMY  2000    Family History  Problem Relation Age of Onset  . Cancer - Other Father        Throat  . Hypertension Mother   . Hyperlipidemia Mother   . Mental illness Mother        Borderline personality d/o, bipolar, manic depressive  . Celiac disease Mother   . Thyroid  disease Mother   . Diabetes Paternal Grandfather   . CAD Neg Hx   . Neuromuscular disorder Neg Hx     SOCIAL HX: se hpi   Current Outpatient Medications:  .  albuterol (VENTOLIN HFA) 108 (90 Base) MCG/ACT inhaler, Inhale 2 puffs into the lungs every 6 (six) hours as needed., Disp: 8.5 g, Rfl: 0 .  azithromycin (ZITHROMAX) 250 MG tablet, 2 tabs day 1, then one tab daily, Disp: 6 tablet, Rfl: 0 .  benzonatate (TESSALON) 100 MG capsule, Take 1 capsule (100 mg total) by mouth 2 (two) times daily as needed for cough., Disp: 20 capsule, Rfl: 0 .  cetirizine (ZYRTEC) 10 MG tablet, Take 10 mg by mouth daily., Disp: , Rfl:  .  dicyclomine (BENTYL) 20 MG tablet, Take 1 tablet (20 mg total) by mouth 2 (two) times daily as needed for spasms., Disp: 60 tablet, Rfl: 3 .  famotidine (PEPCID) 10 MG tablet, Take 10 mg by mouth 2 (two) times daily., Disp: , Rfl:  .  levonorgestrel (MIRENA) 20 MCG/24HR IUD, 1 each by Intrauterine route once., Disp: , Rfl:  .  omeprazole (PRILOSEC) 40 MG capsule, Take 1 capsule (40 mg total) by mouth daily., Disp: 30 capsule, Rfl: 0 .  ondansetron (ZOFRAN) 8 MG tablet, Take 1 tablet (8 mg total) by mouth every 8 (eight) hours as needed for nausea or vomiting., Disp: 90 tablet, Rfl: 0 .  triamcinolone cream (KENALOG) 0.1 %, Apply 1 application  topically 2 (two) times daily., Disp: 30 g, Rfl: 0 .  venlafaxine (EFFEXOR) 75 MG tablet, Take 75 mg by mouth daily., Disp: , Rfl:   Current Facility-Administered Medications:  .  0.9 %  sodium chloride infusion, 500 mL, Intravenous, Continuous, Beavers, Kimberly, MD  EXAM:  VITALS per patient if applicable:  GENERAL: alert, oriented, appears well and in no acute distress  HEENT: atraumatic, conjunttiva clear, no obvious abnormalities on inspection of external nose and ears  NECK: normal movements of the head and neck  LUNGS: on inspection no signs of respiratory distress, breathing rate appears normal, no obvious gross SOB,  gasping or wheezing  CV: no obvious cyanosis  MS: moves all visible extremities without noticeable abnormality  PSYCH/NEURO: pleasant and cooperative, no obvious depression or anxiety, speech and thought processing grossly intact  ASSESSMENT AND PLAN:  Discussed the following assessment and plan:  Intractable headache, unspecified chronicity pattern, unspecified headache type  Right ear pain  Cough   -referral for inperson assessment given degree of pain, persistent issues - needs exam (ENT and neuro), possible referral or imaging pending exam findings -no inperson visits available with [facility] Heckler today per nurse supervisor, Roselyn Reef. Advised [facility] and pt agreed and agrees to go promptly today. I discussed the assessment and treatment plan with the patient. The patient was provided an opportunity to ask questions and all were answered. The patient agreed with the plan and demonstrated an understanding of the instructions.   The patient was advised to call back or seek an in-person evaluation if the symptoms worsen or if the condition fails to improve as anticipated.   [name], DO

## 2019-01-25 NOTE — Telephone Encounter (Signed)
I called the pt and scheduled an appt for 12pm.

## 2019-01-30 ENCOUNTER — Encounter: Payer: Self-pay | Admitting: Family Medicine

## 2019-01-30 ENCOUNTER — Ambulatory Visit (INDEPENDENT_AMBULATORY_CARE_PROVIDER_SITE_OTHER): Payer: 59 | Admitting: Family Medicine

## 2019-01-30 ENCOUNTER — Telehealth: Payer: Self-pay | Admitting: *Deleted

## 2019-01-30 ENCOUNTER — Other Ambulatory Visit: Payer: Self-pay

## 2019-01-30 DIAGNOSIS — B373 Candidiasis of vulva and vagina: Secondary | ICD-10-CM | POA: Diagnosis not present

## 2019-01-30 DIAGNOSIS — R05 Cough: Secondary | ICD-10-CM | POA: Diagnosis not present

## 2019-01-30 DIAGNOSIS — J0110 Acute frontal sinusitis, unspecified: Secondary | ICD-10-CM

## 2019-01-30 DIAGNOSIS — B3731 Acute candidiasis of vulva and vagina: Secondary | ICD-10-CM

## 2019-01-30 DIAGNOSIS — R059 Cough, unspecified: Secondary | ICD-10-CM

## 2019-01-30 MED ORDER — FLUCONAZOLE 150 MG PO TABS: 150.0000 mg | ORAL_TABLET | Freq: Once | ORAL | 0 refills | Status: AC

## 2019-01-30 MED ORDER — FLOVENT HFA 44 MCG/ACT IN AERO: 1.0000 | INHALATION_SPRAY | Freq: Two times a day (BID) | RESPIRATORY_TRACT | 0 refills | Status: DC

## 2019-01-30 MED ORDER — DOXYCYCLINE HYCLATE 100 MG PO TABS: 100.0000 mg | ORAL_TABLET | Freq: Two times a day (BID) | ORAL | 0 refills | Status: DC

## 2019-01-30 NOTE — Telephone Encounter (Signed)
-----   Message from [name], DO sent at 01/30/2019 12:24 PM EDT ----- -follow up with Dr. Charlies Silvers

## 2019-01-30 NOTE — Progress Notes (Signed)
Virtual Visit via Video Note  I connected with Diana James  on 01/30/19 at 12:00 PM EDT by a video enabled telemedicine application and verified that I am speaking with the correct person using two identifiers.  Location patient: home Location provider:work or home office Persons participating in the virtual visit: patient, provider  I discussed the limitations of evaluation and management by telemedicine and the availability of in person appointments. The patient expressed understanding and agreed to proceed.   HPI:  Acute visit for persistent cough: -started 7/13, had URI, COVID neg, developed ear pain - and was treated with zpack -she did not go to [facility] to get imaging after the last visit like we advised as felt better - reports ear pain resolved and headaches inproved -unfortunately has continued to have a bad cough. Now with thick green sinus congestion, frontal sinus pain and pressure, bad cough, ? Wheeze -no SOB, vomiting, worst headache, ear pain, fevers that she is aware of -Temp 99.2  ROS: See pertinent positives and  negatives per HPI.  Past Medical History:  Diagnosis Date  . Allergic rhinitis   . Anxiety   . Elevated blood pressure 10/22/2014  . Elevated cholesterol with high triglycerides 10/22/2014  . History of shingles 11/02/2016   diagnosed and treated by Dr Radene Knee  . Migraine   . Obesity   . Palpitations   . Tobacco abuse 09/20/2012    Past Surgical History:  Procedure Laterality Date  . CESAREAN SECTION  2008, 2010   x2  . TONSILLECTOMY  2000    Family History  Problem Relation Age of Onset  . Cancer - Other Father        Throat  . Hypertension Mother   . Hyperlipidemia Mother   . Mental illness Mother        Borderline personality d/o, bipolar, manic depressive  . Celiac disease Mother   . Thyroid disease Mother   . Diabetes Paternal Grandfather   . CAD Neg Hx   . Neuromuscular disorder Neg Hx     SOCIAL HX: see hpi   Current Outpatient  Medications:  .  albuterol (VENTOLIN HFA) 108 (90 Base) MCG/ACT inhaler, Inhale 2 puffs into the lungs every 6 (six) hours as needed., Disp: 8.5 g, Rfl: 0 .  benzonatate (TESSALON) 100 MG capsule, Take 1 capsule (100 mg total) by mouth 2 (two) times daily as needed for cough., Disp: 20 capsule, Rfl: 0 .  cetirizine (ZYRTEC) 10 MG tablet, Take 10 mg by mouth daily., Disp: , Rfl:  .  dicyclomine (BENTYL) 20 MG tablet, Take 1 tablet (20 mg total) by mouth 2 (two) times daily as needed for spasms., Disp: 60 tablet, Rfl: 3 .  famotidine (PEPCID) 10 MG tablet, Take 10 mg by mouth 2 (two) times daily., Disp: , Rfl:  .  levonorgestrel (MIRENA) 20 MCG/24HR IUD, 1 each by Intrauterine route once., Disp: , Rfl:  .  omeprazole (PRILOSEC) 40 MG capsule, Take 1 capsule (40 mg total) by mouth daily., Disp: 30 capsule, Rfl: 0 .  ondansetron (ZOFRAN) 8 MG tablet, Take 1 tablet (8 mg total) by mouth every 8 (eight) hours as needed for nausea or vomiting., Disp: 90 tablet, Rfl: 0 .  triamcinolone cream (KENALOG) 0.1 %, Apply 1 application topically 2 (two) times daily., Disp: 30 g, Rfl: 0 .  venlafaxine (EFFEXOR) 75 MG tablet, Take 75 mg by mouth daily., Disp: , Rfl:  .  doxycycline (VIBRA-TABS) 100 MG tablet, Take 1 tablet (100 mg total)  by mouth 2 (two) times daily., Disp: 14 tablet, Rfl: 0 .  fluconazole (DIFLUCAN) 150 MG tablet, Take 1 tablet (150 mg total) by mouth once for 1 dose., Disp: 1 tablet, Rfl: 0 .  fluticasone (FLOVENT HFA) 44 MCG/ACT inhaler, Inhale 1 puff into the lungs 2 (two) times daily., Disp: 1 Inhaler, Rfl: 0  Current Facility-Administered Medications:  .  0.9 %  sodium chloride infusion, 500 mL, Intravenous, Continuous, Beavers, Joelene Millin, MD  EXAM:  VITALS per patient if applicable: T 33.8  GENERAL: alert, oriented, appears well and in no acute distress  HEENT: atraumatic, conjunttiva clear, no obvious abnormalities on inspection of external nose and ears  NECK: normal movements of  the head and neck  LUNGS: on inspection no signs of respiratory distress, breathing rate appears normal, no obvious gross SOB, gasping or wheezing  CV: no obvious cyanosis  MS: moves all visible extremities without noticeable abnormality  PSYCH/NEURO: pleasant and cooperative, no obvious depression or anxiety, speech and thought processing grossly intact  ASSESSMENT AND PLAN:  Discussed the following assessment and plan:  Acute non-recurrent frontal sinusitis -  -discussed potential etiologies and query unresolved sinusitis, opted for trial stronger abx for sinusitis as there is some resistance with azithro and close follow up. She agrees to seek care if any more severe headaches or worsening. Declined MRI for the headaches as is doing better.  Cough - trial ICS for the cough/wheeze vs PO steroid - opted against after discussion risks/benefit. Close follow up.  Yeast vaginitis -reports gets with abx and needs diflucan. Sent one tablet.     I discussed the assessment and treatment plan with the patient. The patient was provided an opportunity to ask questions and all were answered. The patient agreed with the plan and demonstrated an understanding of the instructions.   The patient was advised to call back or seek an in-person evaluation if the symptoms worsen or if the condition fails to improve as anticipated.   Follow up instructions: Advised assistant Wendie Simmer to help patient arrange the following: -follow up Thursday.   [name], DO   Patient Instructions  -Take the antibiotic (doxycycline) as prescribed. Avoid sunlight. Can not take this antibiotic if pregnant or nursing.  -Flovent 2 puffs twice daily for 2 weeks  -I hope you are feeling better soon! Seek care promptly if your symptoms worsen or new concerns arise.  -follow up in 2 days.

## 2019-01-30 NOTE — Telephone Encounter (Signed)
I left a detailed message for the pt to call back for a follow up as below.

## 2019-01-30 NOTE — Patient Instructions (Signed)
-  Take the antibiotic (doxycycline) as prescribed. Avoid sunlight. Can not take this antibiotic if pregnant or nursing.  -Flovent 2 puffs twice daily for 2 weeks  -I hope you are feeling better soon! Seek care promptly if your symptoms worsen or new concerns arise.  -follow up in 2 days.

## 2019-02-01 ENCOUNTER — Telehealth (INDEPENDENT_AMBULATORY_CARE_PROVIDER_SITE_OTHER): Payer: 59 | Admitting: Family Medicine

## 2019-02-01 ENCOUNTER — Other Ambulatory Visit: Payer: Self-pay

## 2019-02-01 ENCOUNTER — Encounter: Payer: Self-pay | Admitting: Family Medicine

## 2019-02-01 DIAGNOSIS — J0110 Acute frontal sinusitis, unspecified: Secondary | ICD-10-CM

## 2019-02-01 NOTE — Progress Notes (Signed)
Virtual Visit via Video Note  I connected with Diana James  on 02/01/19 at 10:00 AM EDT by a video enabled telemedicine application and verified that I am speaking with the correct person using two identifiers.  Location patient: home Location provider:work or home office Persons participating in the virtual visit: patient, provider   HPI:  She has been struggling with a respiratory illness for several weeks. See prior notes. Started on doxycycline a few days ago for presumed sinusitis. Today reports is doing "so much better!" The sinus pain, malaise, low grade temp and cough, wheezing have all resolved. Temp today is 98.0 and has no complaints.   ROS: See pertinent positives and negatives per HPI.  Past Medical History:  Diagnosis Date  . Allergic rhinitis   . Anxiety   . Elevated blood pressure 10/22/2014  . Elevated cholesterol with high triglycerides 10/22/2014  . History of shingles 11/02/2016   diagnosed and treated by Dr Radene Knee  . Migraine   . Obesity   . Palpitations   . Tobacco abuse 09/20/2012    Past Surgical History:  Procedure Laterality Date  . CESAREAN SECTION  2008, 2010   x2  . TONSILLECTOMY  2000    Family History  Problem Relation Age of Onset  . Cancer - Other Father        Throat  . Hypertension Mother   . Hyperlipidemia Mother   . Mental illness Mother        Borderline personality d/o, bipolar, manic depressive  . Celiac disease Mother   . Thyroid disease Mother   . Diabetes Paternal Grandfather   . CAD Neg Hx   . Neuromuscular disorder Neg Hx     SOCIAL HX: see hpi   Current Outpatient Medications:  .  albuterol (VENTOLIN HFA) 108 (90 Base) MCG/ACT inhaler, Inhale 2 puffs into the lungs every 6 (six) hours as needed., Disp: 8.5 g, Rfl: 0 .  benzonatate (TESSALON) 100 MG capsule, Take 1 capsule (100 mg total) by mouth 2 (two) times daily as needed for cough., Disp: 20 capsule, Rfl: 0 .  cetirizine (ZYRTEC) 10 MG tablet, Take 10 mg by mouth daily.,  Disp: , Rfl:  .  dicyclomine (BENTYL) 20 MG tablet, Take 1 tablet (20 mg total) by mouth 2 (two) times daily as needed for spasms., Disp: 60 tablet, Rfl: 3 .  doxycycline (VIBRA-TABS) 100 MG tablet, Take 1 tablet (100 mg total) by mouth 2 (two) times daily., Disp: 14 tablet, Rfl: 0 .  famotidine (PEPCID) 10 MG tablet, Take 10 mg by mouth 2 (two) times daily., Disp: , Rfl:  .  fluticasone (FLOVENT HFA) 44 MCG/ACT inhaler, Inhale 1 puff into the lungs 2 (two) times daily., Disp: 1 Inhaler, Rfl: 0 .  levonorgestrel (MIRENA) 20 MCG/24HR IUD, 1 each by Intrauterine route once., Disp: , Rfl:  .  omeprazole (PRILOSEC) 40 MG capsule, Take 1 capsule (40 mg total) by mouth daily., Disp: 30 capsule, Rfl: 0 .  ondansetron (ZOFRAN) 8 MG tablet, Take 1 tablet (8 mg total) by mouth every 8 (eight) hours as needed for nausea or vomiting., Disp: 90 tablet, Rfl: 0 .  triamcinolone cream (KENALOG) 0.1 %, Apply 1 application topically 2 (two) times daily., Disp: 30 g, Rfl: 0 .  venlafaxine (EFFEXOR) 75 MG tablet, Take 75 mg by mouth daily., Disp: , Rfl:   Current Facility-Administered Medications:  .  0.9 %  sodium chloride infusion, 500 mL, Intravenous, Continuous, [place], MD  EXAMTonette Bihari per  patient if applicable: T 56.8  GENERAL: alert, oriented, appears well and in no acute distress  HEENT: atraumatic, conjunttiva clear, no obvious abnormalities on inspection of external nose and ears  NECK: normal movements of the head and neck  LUNGS: on inspection no signs of respiratory distress, breathing rate appears normal, no obvious gross SOB, gasping or wheezing  CV: no obvious cyanosis  MS: moves all visible extremities without noticeable abnormality  PSYCH/NEURO: pleasant and cooperative, no obvious depression or anxiety, speech and thought processing grossly intact  ASSESSMENT AND PLAN:  Discussed the following assessment and plan:  Acute non-recurrent frontal sinusitis   So glad she  is doing better. It seems she probably was suffering from a partially treated bacterial sinusitis given her response to the doxycycline. Continue treatment course. Also advised probiotic. Ensure getting adequate Vit D and C. Follow up as needed.   I discussed the assessment and treatment plan with the patient. The patient was provided an opportunity to ask questions and all were answered. The patient agreed with the plan and demonstrated an understanding of the instructions.   The patient was advised to call back or seek an in-person evaluation if the symptoms worsen or if the condition fails to improve as anticipated.   [name], DO

## 2019-02-14 ENCOUNTER — Other Ambulatory Visit: Payer: Self-pay | Admitting: Family Medicine

## 2019-06-19 ENCOUNTER — Other Ambulatory Visit: Payer: Self-pay | Admitting: Family Medicine

## 2019-06-20 ENCOUNTER — Encounter: Payer: Self-pay | Admitting: Family Medicine

## 2019-07-21 IMAGING — US US ABDOMEN COMPLETE
1 series · 13 of 25 positions shown · non-contrast
Comparison: None.

CLINICAL DATA: Upper abdominal pain with nausea

EXAM:
ABDOMEN ULTRASOUND COMPLETE

[Series 1: us abdomen complete · 0.19mm/px · 13 of 81 slices shown]
[im 1/81]
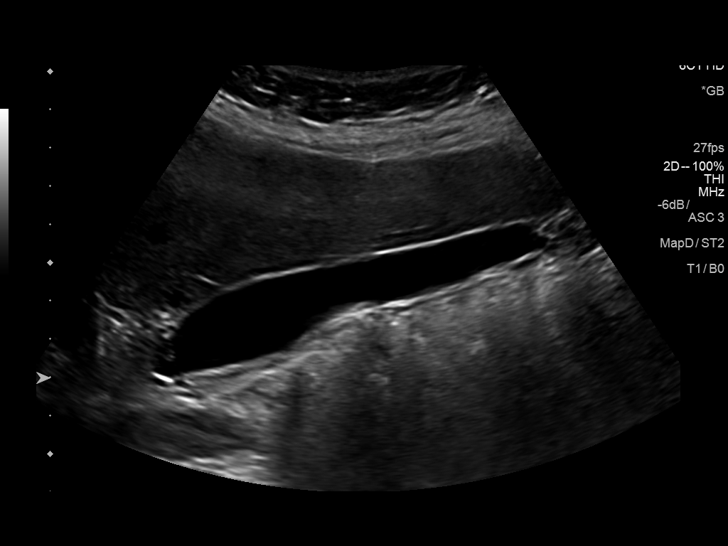
[im 7/81]
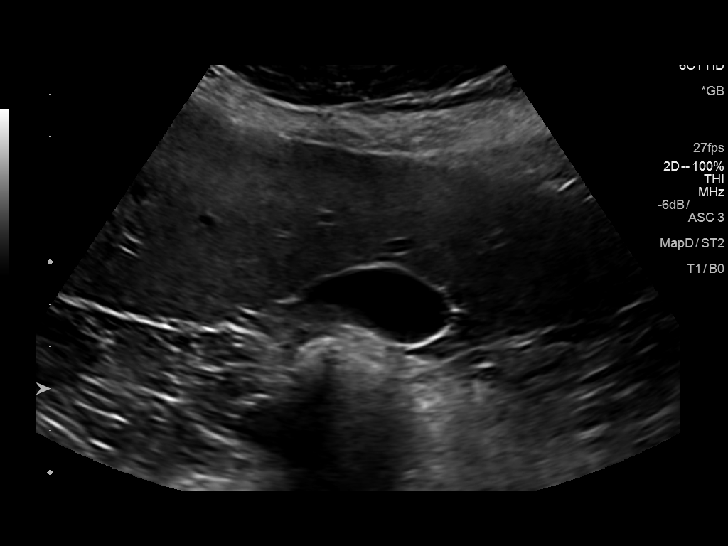
[im 14/81]
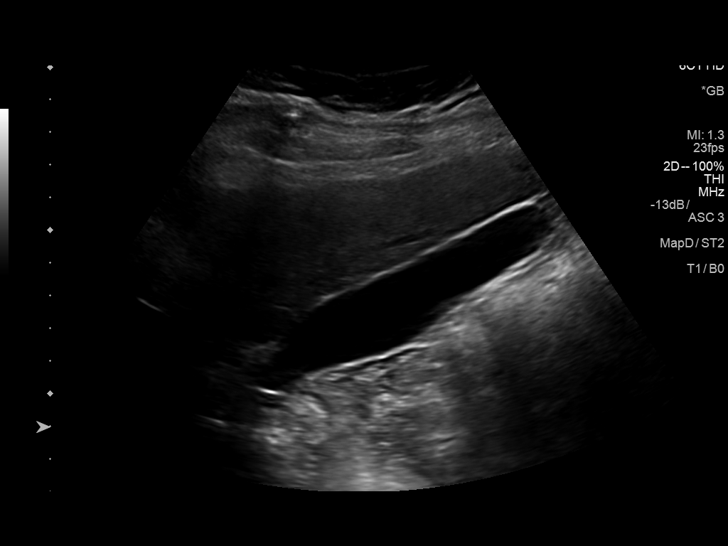
[im 21/81]
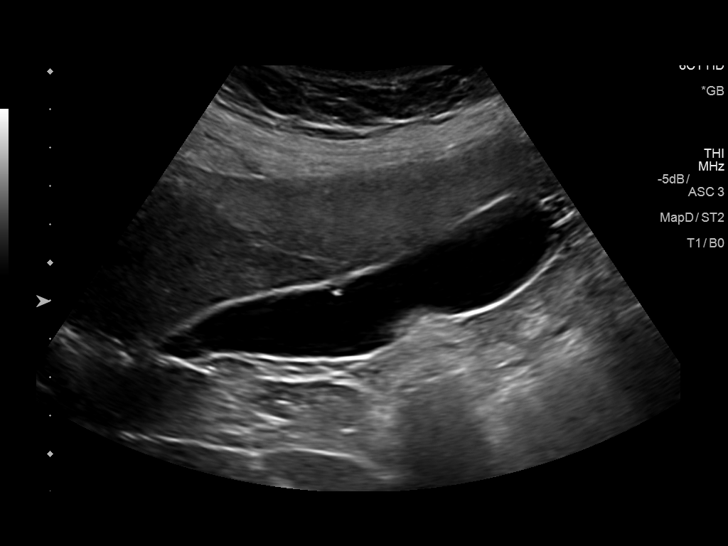
[im 27/81]
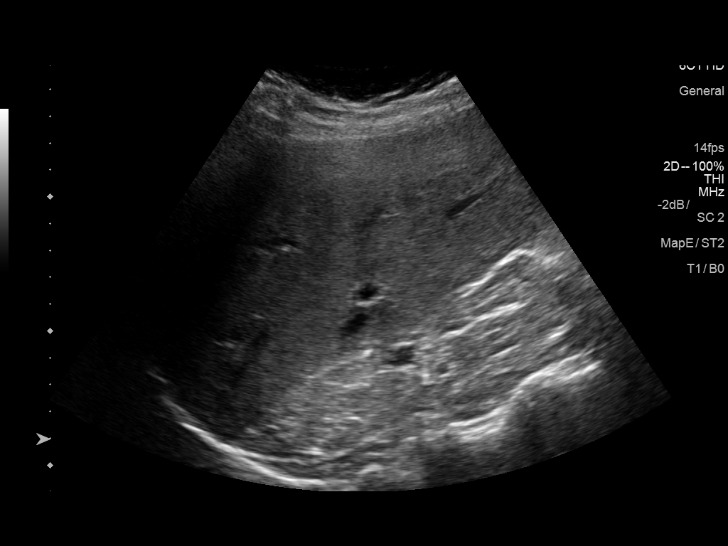
[im 34/81]
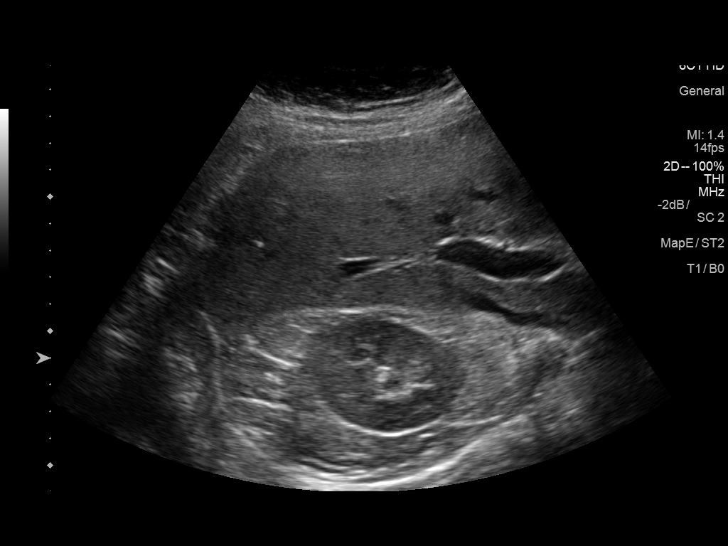
[im 41/81]
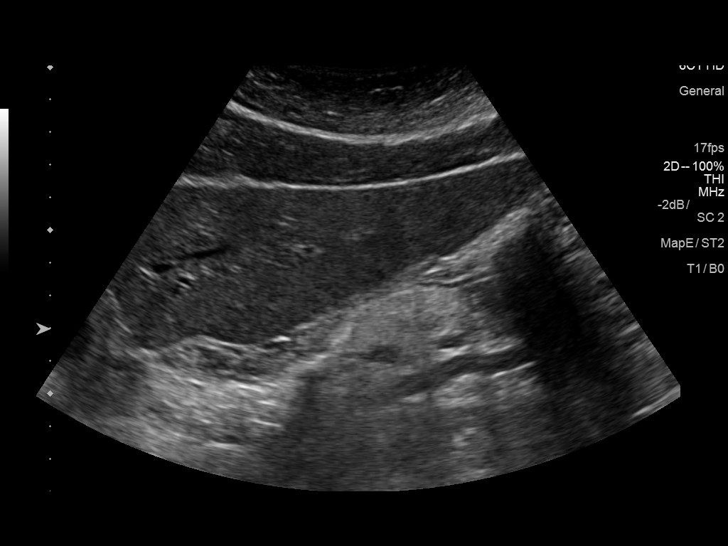
[im 47/81]
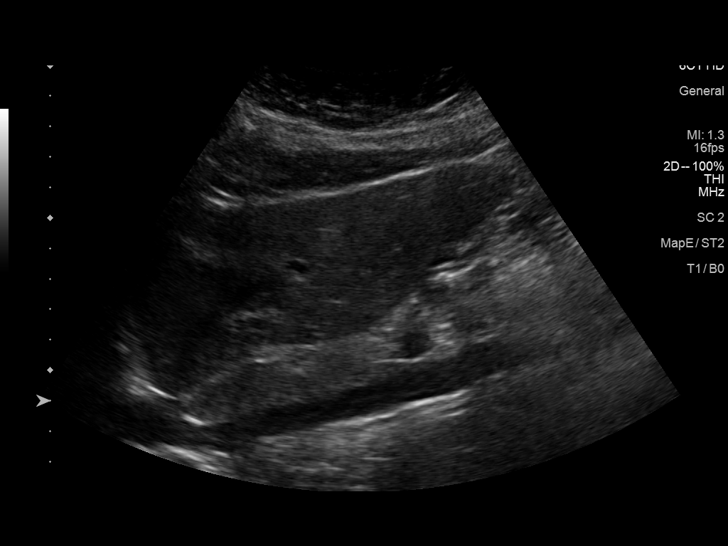
[im 54/81]
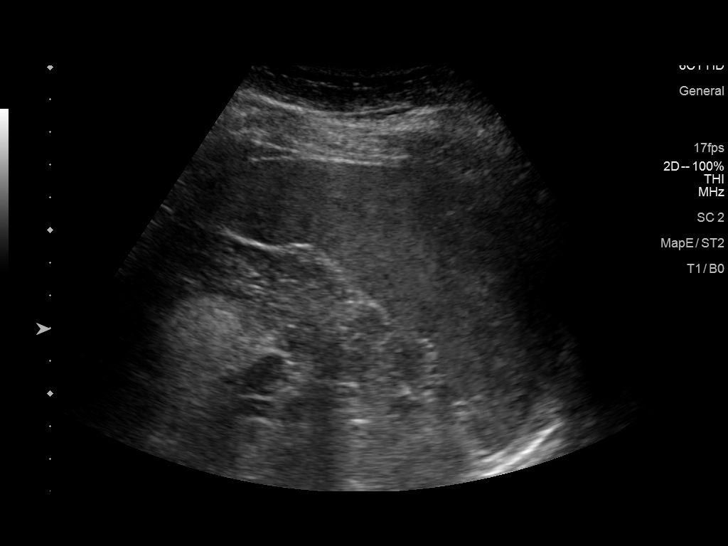
[im 61/81]
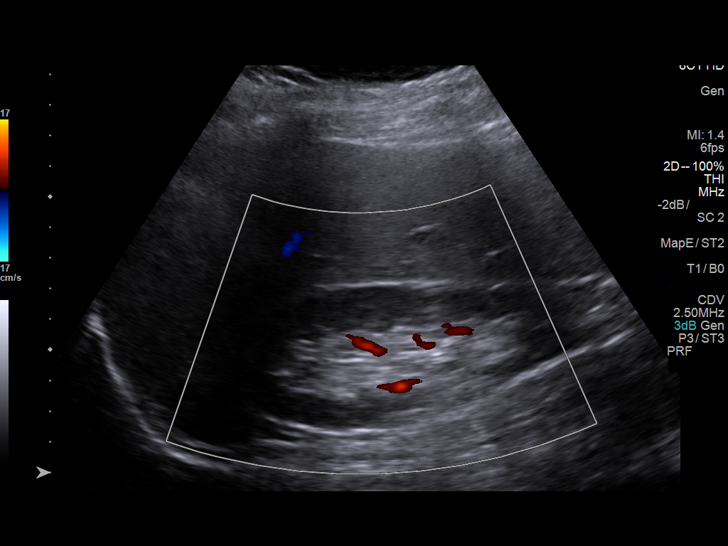
[im 67/81]
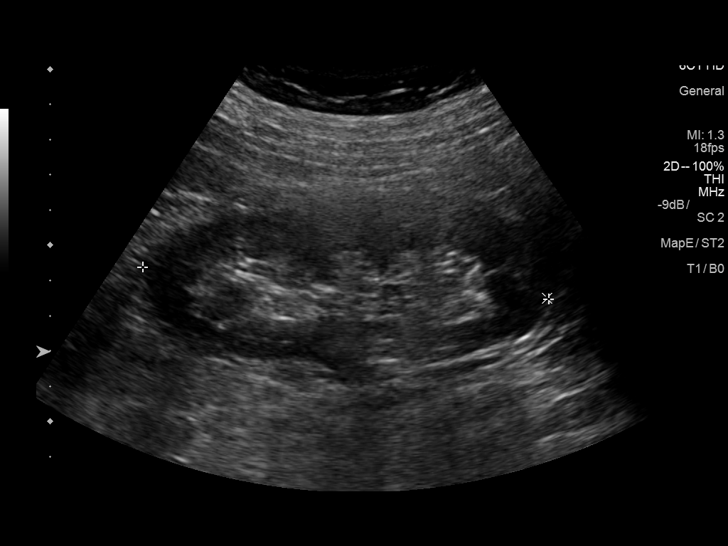
[im 74/81]
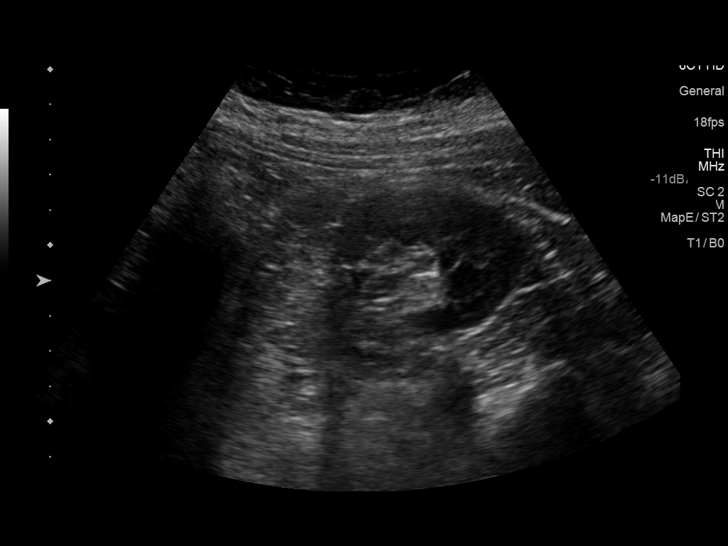
[im 81/81]
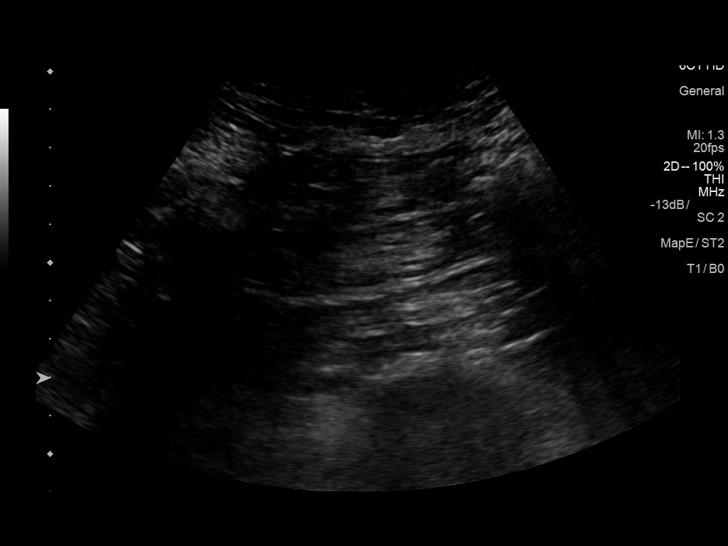

[13 of 25 positions shown; findings below may reference images not displayed]

FINDINGS: Gallbladder: No gallstones or wall thickening visualized. No
pericholecystic fluid. There is a 4 mm echogenic focus along the
anterior gallbladder wall which neither moves nor shadows, a
probable small polyp. No sonographic Murphy sign noted by
sonographer.

Common bile duct: Diameter: 2 mm. No intrahepatic, common hepatic,
or common bile duct dilatation.

Liver: No focal lesion identified. Within normal limits in
parenchymal echogenicity. Portal vein is patent on color Doppler
imaging with normal direction of blood flow towards the liver.

IVC: No abnormality visualized.

Pancreas: No pancreatic mass or inflammatory focus.

Spleen: Size and appearance within normal limits.

Right Kidney: Length: 11.5 cm. Echogenicity within normal limits. No
mass or hydronephrosis visualized.

Left Kidney: Length: 12.1 cm. Echogenicity within normal limits. No
mass or hydronephrosis visualized.

Abdominal aorta: No aneurysm visualized.

Other findings: No demonstrable ascites.
IMPRESSION: 1. 4 mm apparent polyp within the gallbladder. Per consensus
guidelines, a polyp of this small size does not warrant additional
imaging surveillance. Gallbladder otherwise appears unremarkable.

2.  Study otherwise unremarkable.

## 2019-08-15 NOTE — H&P (Signed)
Patient name Diana James, Diana James DICTATION# [id number] CSN# [id number]  Arvella Nigh, MD 08/15/2019 5:26 AM

## 2019-08-16 NOTE — H&P (Signed)
NAME: Diana James, KRIER MEDICAL RECORD [id number] ACCOUNT [id number] DATE OF BIRTH:08/28/1980 FACILITY: [facility] LOCATION:  PHYSICIAN:Jakerra Floyd Sherran Needs, MD  HISTORY AND PHYSICAL  DATE OF ADMISSION:  08/30/2019  HISTORY OF PRESENT ILLNESS:  The patient is a 39 year old gravida 2, para 2 female comes in for laparoscopic bilateral tubal ligation as well as removal of Mirena IUD.  The patient has had a Mirena IUD in place.  She is having problems with irregular bleeding.  Bleeding may last up to 7 days and she will bleed 2 to 3 times per month.  She is also having pain and discomfort with her cycle and pain with intercourse.  She  does have a history of interstitial cystitis and irritable bowel syndrome.  We felt this may be contributing to the pain with intercourse.  We tried the antibiotic trick without help.  She comes in now for permanent sterilization and removal of IUD.  ALLERGIES:  OMNICEF, PENICILLINS.  MEDICATIONS:  She is on omeprazole 40 mg daily.  Ondansetron 8 mg tablets as needed for nausea.  Effexor 75 mg daily.  PAST MEDICAL HISTORY:  She has a history of irritable bowel syndrome as well as interstitial cystitis.  She has migraine headaches.  PAST SURGICAL HISTORY:  She has had 2 prior cesarean sections.  Otherwise, usual childhood diseases without any sequelae.  SOCIAL HISTORY:  She does have a 1/2 pack per day tobacco use, no alcohol use.  FAMILY HISTORY:  Noncontributory.  REVIEW OF SYSTEMS:  Noncontributory.  PHYSICAL EXAMINATION: VITAL SIGNS:  The patient is afebrile with stable vital signs. HEENT:  The patient is normocephalic.  Pupils equal, round and reactive to light and accommodation.  Extraocular movements are intact.  Sclerae and are clear.  Oropharynx clear. LUNGS:  Clear. CARDIOVASCULAR:  Regular rhythm and rate without murmurs or gallops. ABDOMEN:  Benign.  Does have a well-healed low transverse incision. PELVIC:  Normal external genitalia.  Vaginal mucosa is  clear.  Cervix unremarkable.  Uterus normal size, shape and contour.  Adnexa free of mass or tenderness. EXTREMITIES:  Trace edema.   NEUROLOGIC:  Grossly within normal limits.  IMPRESSION: 1.  Multiparity, desires sterility. 2.  Abnormal bleeding with intrauterine device. 3.  Pelvic pain and dyspareunia. 4.  Interstitial cystitis. 5.  Irritable bowel syndrome.  PLAN:  The patient to undergo a laparoscopic bilateral tubal ligation.  The potential irreversibility of sterilization was explained.  Failure rate of 1 in 400 was quoted.  Failures can be in the form of ectopic pregnancy requiring further surgical  management.  There is a risk of surgery of infection, risk of injury to adjacent organs such as bladder or bowel that could require further exploratory surgery, risk of deep venous thrombosis and pulmonary embolus.  The patient expressed understanding of  indication, risks.  Alternatives for birth control have been discussed and again the potential irreversibility of sterilization was explained.  The patient expressed understanding of indications, risks, and alternatives.  CN/NUANCE  D:08/15/2019 T:08/15/2019 JOB:[id number]

## 2019-08-23 ENCOUNTER — Encounter (HOSPITAL_BASED_OUTPATIENT_CLINIC_OR_DEPARTMENT_OTHER): Payer: Self-pay | Admitting: Obstetrics and Gynecology

## 2019-08-23 ENCOUNTER — Other Ambulatory Visit: Payer: Self-pay

## 2019-08-23 NOTE — Progress Notes (Signed)
Spoke w/ via phone for pre-op interview--- PT Lab needs dos---- none              Lab results------ getting CBC, Serum preg., T&S done 08-27-2019 (previous ekg 08-24-2012 in chart/ epic) COVID test ------ 08-27-2019  @ 0945 Arrive at ------- 0530 NPO after ------ MN Medications to take morning of surgery ----- Pepcid, Prilosec w/ sips of water Diabetic medication ----- n/a Patient Special Instructions ----- n/a Pre-Op special Istructions ----- n/a Patient verbalized understanding of instructions that were given at this phone interview. Patient denies shortness of breath, chest pain, fever, cough a this phone interview.

## 2019-08-27 ENCOUNTER — Encounter (HOSPITAL_COMMUNITY)
Admission: RE | Admit: 2019-08-27 | Discharge: 2019-08-27 | Disposition: A | Discharge status: Home / Self Care | Payer: 59 | Source: Ambulatory Visit | Attending: Obstetrics and Gynecology | Admitting: Obstetrics and Gynecology

## 2019-08-27 ENCOUNTER — Other Ambulatory Visit (HOSPITAL_COMMUNITY)
Admission: RE | Admit: 2019-08-27 | Discharge: 2019-08-27 | Disposition: A | Discharge status: Home / Self Care | Payer: 59 | Source: Ambulatory Visit | Attending: Obstetrics and Gynecology | Admitting: Obstetrics and Gynecology

## 2019-08-27 ENCOUNTER — Other Ambulatory Visit: Payer: Self-pay

## 2019-08-27 DIAGNOSIS — Z01812 Encounter for preprocedural laboratory examination: Secondary | ICD-10-CM | POA: Insufficient documentation

## 2019-08-27 DIAGNOSIS — Z20822 Contact with and (suspected) exposure to covid-19: Secondary | ICD-10-CM | POA: Diagnosis not present

## 2019-08-27 LAB — CBC
HCT: 42.8 % (ref 36.0–46.0)
Hemoglobin: 14.1 g/dL (ref 12.0–15.0)
MCH: 31.8 pg (ref 26.0–34.0)
MCHC: 32.9 g/dL (ref 30.0–36.0)
MCV: 96.6 fL (ref 80.0–100.0)
Platelets: 263 10*3/uL (ref 150–400)
RBC: 4.43 MIL/uL (ref 3.87–5.11)
RDW: 12.4 % (ref 11.5–15.5)
WBC: 11 10*3/uL — ABNORMAL HIGH (ref 4.0–10.5)
nRBC: 0 % (ref 0.0–0.2)

## 2019-08-27 LAB — SARS CORONAVIRUS 2 (TAT 6-24 HRS): SARS Coronavirus 2: NEGATIVE

## 2019-08-27 LAB — ABO/RH: ABO/RH(D): O POS

## 2019-08-30 ENCOUNTER — Ambulatory Visit (HOSPITAL_BASED_OUTPATIENT_CLINIC_OR_DEPARTMENT_OTHER)
Admission: RE | Admit: 2019-08-30 | Discharge: 2019-08-30 | Disposition: A | Discharge status: Home / Self Care | Payer: 59 | Attending: Obstetrics and Gynecology | Admitting: Obstetrics and Gynecology

## 2019-08-30 ENCOUNTER — Other Ambulatory Visit: Payer: Self-pay

## 2019-08-30 ENCOUNTER — Ambulatory Visit (HOSPITAL_BASED_OUTPATIENT_CLINIC_OR_DEPARTMENT_OTHER): Payer: 59 | Admitting: Anesthesiology

## 2019-08-30 ENCOUNTER — Encounter (HOSPITAL_BASED_OUTPATIENT_CLINIC_OR_DEPARTMENT_OTHER): Payer: Self-pay | Admitting: Obstetrics and Gynecology

## 2019-08-30 ENCOUNTER — Encounter (HOSPITAL_BASED_OUTPATIENT_CLINIC_OR_DEPARTMENT_OTHER)
Admission: RE | Disposition: A | Discharge status: Home / Self Care | Payer: Self-pay | Source: Home / Self Care | Attending: Obstetrics and Gynecology

## 2019-08-30 DIAGNOSIS — N941 Unspecified dyspareunia: Secondary | ICD-10-CM | POA: Insufficient documentation

## 2019-08-30 DIAGNOSIS — K589 Irritable bowel syndrome without diarrhea: Secondary | ICD-10-CM | POA: Diagnosis not present

## 2019-08-30 DIAGNOSIS — R102 Pelvic and perineal pain: Secondary | ICD-10-CM | POA: Insufficient documentation

## 2019-08-30 DIAGNOSIS — Z6837 Body mass index (BMI) 37.0-37.9, adult: Secondary | ICD-10-CM | POA: Insufficient documentation

## 2019-08-30 DIAGNOSIS — F17200 Nicotine dependence, unspecified, uncomplicated: Secondary | ICD-10-CM | POA: Diagnosis not present

## 2019-08-30 DIAGNOSIS — N3010 Interstitial cystitis (chronic) without hematuria: Secondary | ICD-10-CM | POA: Diagnosis not present

## 2019-08-30 DIAGNOSIS — Z880 Allergy status to penicillin: Secondary | ICD-10-CM | POA: Insufficient documentation

## 2019-08-30 DIAGNOSIS — Z79899 Other long term (current) drug therapy: Secondary | ICD-10-CM | POA: Diagnosis not present

## 2019-08-30 DIAGNOSIS — N803 Endometriosis of pelvic peritoneum: Secondary | ICD-10-CM | POA: Insufficient documentation

## 2019-08-30 DIAGNOSIS — R519 Headache, unspecified: Secondary | ICD-10-CM | POA: Diagnosis not present

## 2019-08-30 DIAGNOSIS — F419 Anxiety disorder, unspecified: Secondary | ICD-10-CM | POA: Insufficient documentation

## 2019-08-30 DIAGNOSIS — Z302 Encounter for sterilization: Secondary | ICD-10-CM | POA: Insufficient documentation

## 2019-08-30 DIAGNOSIS — Z881 Allergy status to other antibiotic agents status: Secondary | ICD-10-CM | POA: Insufficient documentation

## 2019-08-30 DIAGNOSIS — E669 Obesity, unspecified: Secondary | ICD-10-CM | POA: Diagnosis not present

## 2019-08-30 HISTORY — DX: Personal history of other diseases of the digestive system: Z87.19

## 2019-08-30 HISTORY — DX: Personal history of colonic polyps: Z86.010

## 2019-08-30 HISTORY — PX: LAPAROSCOPIC TUBAL LIGATION: SHX1937

## 2019-08-30 HISTORY — DX: Other allergy status, other than to drugs and biological substances: Z91.09

## 2019-08-30 HISTORY — DX: Abnormal uterine and vaginal bleeding, unspecified: N93.9

## 2019-08-30 HISTORY — DX: Irritable bowel syndrome, unspecified: K58.9

## 2019-08-30 HISTORY — PX: LAPAROSCOPY: SHX197

## 2019-08-30 HISTORY — DX: Interstitial cystitis (chronic) without hematuria: N30.10

## 2019-08-30 HISTORY — DX: Personal history of colon polyps, unspecified: Z86.0100

## 2019-08-30 HISTORY — DX: Personal history of other specified conditions: Z87.898

## 2019-08-30 HISTORY — DX: Presence of spectacles and contact lenses: Z97.3

## 2019-08-30 HISTORY — DX: Personal history of other diseases of the nervous system and sense organs: Z86.69

## 2019-08-30 LAB — HCG, SERUM, QUALITATIVE: Preg, Serum: NEGATIVE

## 2019-08-30 SURGERY — LAPAROSCOPY, DIAGNOSTIC
Anesthesia: General | Site: Abdomen

## 2019-08-30 MED ORDER — FENTANYL CITRATE (PF) 100 MCG/2ML IJ SOLN
INTRAMUSCULAR | Status: DC | PRN
  Administered 2019-08-30 (×2): 50 ug via INTRAVENOUS

## 2019-08-30 MED ORDER — PROPOFOL 500 MG/50ML IV EMUL
INTRAVENOUS | Status: AC
  Filled 2019-08-30: qty 50

## 2019-08-30 MED ORDER — KETOROLAC TROMETHAMINE 30 MG/ML IJ SOLN
INTRAMUSCULAR | Status: DC | PRN
  Administered 2019-08-30: 30 mg via INTRAVENOUS

## 2019-08-30 MED ORDER — CLINDAMYCIN PHOSPHATE 900 MG/50ML IV SOLN
INTRAVENOUS | Status: AC
  Filled 2019-08-30: qty 50

## 2019-08-30 MED ORDER — LIDOCAINE 2% (20 MG/ML) 5 ML SYRINGE
INTRAMUSCULAR | Status: AC
  Filled 2019-08-30: qty 5

## 2019-08-30 MED ORDER — LIDOCAINE 2% (20 MG/ML) 5 ML SYRINGE
INTRAMUSCULAR | Status: DC | PRN
  Administered 2019-08-30: 60 mg via INTRAVENOUS

## 2019-08-30 MED ORDER — OXYCODONE HCL 5 MG PO TABS
5.0000 mg | ORAL_TABLET | Freq: Once | ORAL | Status: DC | PRN
  Filled 2019-08-30: qty 1

## 2019-08-30 MED ORDER — ONDANSETRON HCL 4 MG/2ML IJ SOLN
INTRAMUSCULAR | Status: AC
  Filled 2019-08-30: qty 2

## 2019-08-30 MED ORDER — ROCURONIUM BROMIDE 10 MG/ML (PF) SYRINGE
PREFILLED_SYRINGE | INTRAVENOUS | Status: DC | PRN
  Administered 2019-08-30: 60 mg via INTRAVENOUS

## 2019-08-30 MED ORDER — CIPROFLOXACIN IN D5W 400 MG/200ML IV SOLN
INTRAVENOUS | Status: AC
  Filled 2019-08-30: qty 200

## 2019-08-30 MED ORDER — SCOPOLAMINE 1 MG/3DAYS TD PT72
1.0000 | MEDICATED_PATCH | TRANSDERMAL | Status: DC
  Administered 2019-08-30: 07:00:00 1 via TRANSDERMAL
  Administered 2019-08-30: 1.5 mg via TRANSDERMAL
  Filled 2019-08-30: qty 1

## 2019-08-30 MED ORDER — ONDANSETRON HCL 4 MG/2ML IJ SOLN
INTRAMUSCULAR | Status: DC | PRN
  Administered 2019-08-30: 4 mg via INTRAVENOUS

## 2019-08-30 MED ORDER — DEXAMETHASONE SODIUM PHOSPHATE 10 MG/ML IJ SOLN
INTRAMUSCULAR | Status: AC
  Filled 2019-08-30: qty 1

## 2019-08-30 MED ORDER — ACETAMINOPHEN 325 MG PO TABS
325.0000 mg | ORAL_TABLET | ORAL | Status: DC | PRN
  Filled 2019-08-30: qty 2

## 2019-08-30 MED ORDER — ARTIFICIAL TEARS OPHTHALMIC OINT
TOPICAL_OINTMENT | OPHTHALMIC | Status: AC
  Filled 2019-08-30: qty 3.5

## 2019-08-30 MED ORDER — KETOROLAC TROMETHAMINE 30 MG/ML IJ SOLN
30.0000 mg | Freq: Once | INTRAMUSCULAR | Status: DC | PRN
  Filled 2019-08-30: qty 1

## 2019-08-30 MED ORDER — MIDAZOLAM HCL 2 MG/2ML IJ SOLN
INTRAMUSCULAR | Status: AC
  Filled 2019-08-30: qty 2

## 2019-08-30 MED ORDER — OXYCODONE-ACETAMINOPHEN 7.5-325 MG PO TABS: 1.0000 | ORAL_TABLET | Freq: Four times a day (QID) | ORAL | 0 refills | Status: AC | PRN

## 2019-08-30 MED ORDER — PROPOFOL 10 MG/ML IV BOLUS
INTRAVENOUS | Status: DC | PRN
  Administered 2019-08-30: 180 mg via INTRAVENOUS

## 2019-08-30 MED ORDER — CIPROFLOXACIN IN D5W 400 MG/200ML IV SOLN
400.0000 mg | INTRAVENOUS | Status: AC
  Administered 2019-08-30: 400 mg via INTRAVENOUS
  Filled 2019-08-30: qty 200

## 2019-08-30 MED ORDER — DEXAMETHASONE SODIUM PHOSPHATE 10 MG/ML IJ SOLN
INTRAMUSCULAR | Status: DC | PRN
  Administered 2019-08-30: 10 mg via INTRAVENOUS

## 2019-08-30 MED ORDER — SCOPOLAMINE 1 MG/3DAYS TD PT72
MEDICATED_PATCH | TRANSDERMAL | Status: AC
  Filled 2019-08-30: qty 1

## 2019-08-30 MED ORDER — BUPIVACAINE HCL 0.25 % IJ SOLN
INTRAMUSCULAR | Status: DC | PRN
  Administered 2019-08-30: 20 mL

## 2019-08-30 MED ORDER — ROCURONIUM BROMIDE 10 MG/ML (PF) SYRINGE
PREFILLED_SYRINGE | INTRAVENOUS | Status: AC
  Filled 2019-08-30: qty 10

## 2019-08-30 MED ORDER — ONDANSETRON HCL 4 MG/2ML IJ SOLN
4.0000 mg | Freq: Once | INTRAMUSCULAR | Status: DC | PRN
  Filled 2019-08-30: qty 2

## 2019-08-30 MED ORDER — FENTANYL CITRATE (PF) 100 MCG/2ML IJ SOLN
INTRAMUSCULAR | Status: AC
  Filled 2019-08-30: qty 2

## 2019-08-30 MED ORDER — ACETAMINOPHEN 160 MG/5ML PO SOLN
325.0000 mg | ORAL | Status: DC | PRN
  Filled 2019-08-30: qty 20.3

## 2019-08-30 MED ORDER — SUGAMMADEX SODIUM 200 MG/2ML IV SOLN
INTRAVENOUS | Status: DC | PRN
  Administered 2019-08-30: 400 mg via INTRAVENOUS

## 2019-08-30 MED ORDER — ACETAMINOPHEN 500 MG PO TABS
ORAL_TABLET | ORAL | Status: AC
  Filled 2019-08-30: qty 2

## 2019-08-30 MED ORDER — LACTATED RINGERS IV SOLN
INTRAVENOUS | Status: DC
  Filled 2019-08-30: qty 1000

## 2019-08-30 MED ORDER — KETOROLAC TROMETHAMINE 30 MG/ML IJ SOLN
INTRAMUSCULAR | Status: AC
  Filled 2019-08-30: qty 1

## 2019-08-30 MED ORDER — ACETAMINOPHEN 325 MG PO TABS
ORAL_TABLET | ORAL | Status: DC | PRN
  Administered 2019-08-30: 1000 mg via ORAL

## 2019-08-30 MED ORDER — OXYCODONE HCL 5 MG/5ML PO SOLN
5.0000 mg | Freq: Once | ORAL | Status: DC | PRN
  Filled 2019-08-30: qty 5

## 2019-08-30 MED ORDER — CLINDAMYCIN PHOSPHATE 900 MG/50ML IV SOLN
900.0000 mg | INTRAVENOUS | Status: AC
  Administered 2019-08-30: 900 mg via INTRAVENOUS
  Filled 2019-08-30: qty 50

## 2019-08-30 MED ORDER — FENTANYL CITRATE (PF) 100 MCG/2ML IJ SOLN
25.0000 ug | INTRAMUSCULAR | Status: DC | PRN
  Filled 2019-08-30: qty 1

## 2019-08-30 MED ORDER — MIDAZOLAM HCL 2 MG/2ML IJ SOLN
INTRAMUSCULAR | Status: DC | PRN
  Administered 2019-08-30: 2 mg via INTRAVENOUS

## 2019-08-30 MED ORDER — MEPERIDINE HCL 25 MG/ML IJ SOLN
6.2500 mg | INTRAMUSCULAR | Status: DC | PRN
  Filled 2019-08-30: qty 1

## 2019-08-30 SURGICAL SUPPLY — 45 items
APPLICATOR COTTON TIP 6 STRL (MISCELLANEOUS) IMPLANT
APPLICATOR COTTON TIP 6IN STRL (MISCELLANEOUS) IMPLANT
BAG RETRIEVAL 10MM (BASKET)
CANISTER SUCT 1200ML W/VALVE (MISCELLANEOUS) IMPLANT
CANISTER SUCT 3000ML PPV (MISCELLANEOUS) ×2 IMPLANT
CATH ROBINSON RED A/P 16FR (CATHETERS) ×4 IMPLANT
COVER MAYO STAND STRL (DRAPES) ×4 IMPLANT
COVER WAND RF STERILE (DRAPES) ×4 IMPLANT
DERMABOND ADVANCED (GAUZE/BANDAGES/DRESSINGS) ×2
DERMABOND ADVANCED .7 DNX12 (GAUZE/BANDAGES/DRESSINGS) ×2 IMPLANT
DRSG COVADERM PLUS 2X2 (GAUZE/BANDAGES/DRESSINGS) IMPLANT
DURAPREP 26ML APPLICATOR (WOUND CARE) ×4 IMPLANT
ELECT REM PT RETURN 9FT ADLT (ELECTROSURGICAL) ×4
ELECTRODE REM PT RTRN 9FT ADLT (ELECTROSURGICAL) ×2 IMPLANT
GAUZE 4X4 16PLY RFD (DISPOSABLE) ×4 IMPLANT
GLOVE BIO SURGEON STRL SZ7 (GLOVE) ×8 IMPLANT
GOWN STRL REUS W/TWL XL LVL3 (GOWN DISPOSABLE) ×4 IMPLANT
KIT TURNOVER CYSTO (KITS) ×4 IMPLANT
NEEDLE INSUFFLATION 120MM (ENDOMECHANICALS) IMPLANT
NS IRRIG 500ML POUR BTL (IV SOLUTION) ×4 IMPLANT
PACK LAPAROSCOPY BASIN (CUSTOM PROCEDURE TRAY) ×4 IMPLANT
PACK TRENDGUARD 450 HYBRID PRO (MISCELLANEOUS) IMPLANT
PAD OB MATERNITY 4.3X12.25 (PERSONAL CARE ITEMS) ×4 IMPLANT
PAD PREP 24X48 CUFFED NSTRL (MISCELLANEOUS) ×4 IMPLANT
SCISSORS LAP 5X45 EPIX DISP (ENDOMECHANICALS) IMPLANT
SEALER TISSUE G2 CVD JAW 45CM (ENDOMECHANICALS) IMPLANT
SET IRRIG TUBING LAPAROSCOPIC (IRRIGATION / IRRIGATOR) IMPLANT
SET IRRIG Y TYPE TUR BLADDER L (SET/KITS/TRAYS/PACK) IMPLANT
SET TUBE SMOKE EVAC HIGH FLOW (TUBING) ×4 IMPLANT
SUT VIC AB 3-0 PS2 18 (SUTURE) ×2
SUT VIC AB 3-0 PS2 18XBRD (SUTURE) ×2 IMPLANT
SUT VICRYL 0 ENDOLOOP (SUTURE) IMPLANT
SUT VICRYL 0 UR6 27IN ABS (SUTURE) IMPLANT
SUT VICRYL 4-0 PS2 18IN ABS (SUTURE) IMPLANT
SYS BAG RETRIEVAL 10MM (BASKET)
SYSTEM BAG RETRIEVAL 10MM (BASKET) IMPLANT
TOWEL OR 17X26 10 PK STRL BLUE (TOWEL DISPOSABLE) ×8 IMPLANT
TRENDGUARD 450 HYBRID PRO PACK (MISCELLANEOUS)
TROCAR BALLN 12MMX100 BLUNT (TROCAR) IMPLANT
TROCAR OPTI TIP 5M 100M (ENDOMECHANICALS) ×4 IMPLANT
TROCAR XCEL DIL TIP R 11M (ENDOMECHANICALS) IMPLANT
TUBE CONNECTING 12'X1/4 (SUCTIONS)
TUBE CONNECTING 12X1/4 (SUCTIONS) IMPLANT
WARMER LAPAROSCOPE (MISCELLANEOUS) ×4 IMPLANT
WATER STERILE IRR 500ML POUR (IV SOLUTION) ×4 IMPLANT

## 2019-08-30 NOTE — Transfer of Care (Signed)
Immediate Anesthesia Transfer of Care Note  Patient: Diana James  Procedure(s) Performed: LAPAROSCOPY DIAGNOSTIC (N/A Abdomen) LAPAROSCOPIC TUBAL LIGATION (Abdomen)  Patient Location: PACU  Anesthesia Type:General  Level of Consciousness: awake, alert , oriented and patient cooperative  Airway & Oxygen Therapy: Patient Spontanous Breathing and Patient connected to nasal cannula oxygen  Post-op Assessment: Report given to RN and Post -op Vital signs reviewed and stable  Post vital signs: Reviewed and stable  Last Vitals:  Vitals Value Taken Time  BP    Temp    Pulse 96 08/30/19 0829  Resp    SpO2 94 % 08/30/19 0829  Vitals shown include unvalidated device data.  Last Pain:  Vitals:   08/30/19 0623  TempSrc: Oral         Complications: No apparent anesthesia complications

## 2019-08-30 NOTE — Discharge Instructions (Signed)
NO ADVIL, ALEVE, MOTRIN, IBUPROFEN UNTIL 215 PM TODAY  Post Anesthesia Home Care Instructions  Activity: Get plenty of rest for the remainder of the day. A responsible individual must stay with you for 24 hours following the procedure.  For the next 24 hours, DO NOT: -Drive a car -Paediatric nurse -Drink alcoholic beverages -Take any medication unless instructed by your physician -Make any legal decisions or sign important papers.  Meals: Start with liquid foods such as gelatin or soup. Progress to regular foods as tolerated. Avoid greasy, spicy, heavy foods. If nausea and/or vomiting occur, drink only clear liquids until the nausea and/or vomiting subsides. Call your physician if vomiting continues.  Special Instructions/Symptoms: Your throat may feel dry or sore from the anesthesia or the breathing tube placed in your throat during surgery. If this causes discomfort, gargle with warm salt water. The discomfort should disappear within 24 hours.  If you had a scopolamine patch placed behind your ear for the management of post- operative nausea and/or vomiting:  1. The medication in the patch is effective for 72 hours, after which it should be removed.  Wrap patch in a tissue and discard in the trash. Wash hands thoroughly with soap and water. 2. You may remove the patch earlier than 72 hours if you experience unpleasant side effects which may include dry mouth, dizziness or visual disturbances. 3. Avoid touching the patch. Wash your hands with soap and water after contact with the patch.

## 2019-08-30 NOTE — Op Note (Signed)
NAME: Diana James, Diana James MEDICAL RECORD [id number] ACCOUNT [id number] DATE OF BIRTH:Aug 22, 1980 FACILITY: [facility] LOCATION: [facility] PHYSICIAN:Nathaneal Sommers Sherran Needs, MD  OPERATIVE REPORT  DATE OF PROCEDURE:  08/30/2019  PREOPERATIVE DIAGNOSES: 1.  Multiparity, desires sterility. 2.  Pelvic pain.  POSTOPERATIVE DIAGNOSES: 1.  Multiparity, desires sterility. 2.  Pelvic pain  3.  Findings of endometriosis.  OPERATIVE PROCEDURES:   1.  Diagnostic laparoscopy with laparoscopic bilateral tubal fulguration.   2.  Cautery of endometriotic implants.  SURGEON:  Darlyn Chamber, MD  ANESTHESIA:  General endotracheal.  ESTIMATED BLOOD LOSS:  Minimal.  COMPLICATIONS:  None.  INDICATIONS:  Dictated in history and physical.  DESCRIPTION OF PROCEDURE:  The patient was taken to the OR and placed in the supine position.  After a satisfactory level of general endotracheal anesthesia was obtained, the patient was placed in the dorsal lithotomy position using the Allen stirrups.   At this point in time, perineum and vagina were prepped out with Betadine.  Bladder was emptied by catheterization.  A Hasson cannula was put in place and secured.  The patient's abdomen was prepped with DuraPrep.  After a period of time, the patient was  draped in a sterile field.  A subumbilical incision was made with a knife.  Veress needle was introduced into the abdominal cavity.  Abdomen inflated with approximately 3 L of carbon dioxide.  Operating laparoscope was then introduced.  Visualization  revealed no evidence of injury to adjacent organs.  A 5 mm trocar was put in place under direct visualization.  Visualization revealed a normal appendix, cecum was normal.  Upper abdomen including liver tip and gallbladder were clear.  Both lateral  gutters were clear.  She had some implants of endometriosis on the anterior uterus and 1 implant in the cul-de-sac.  There were no adhesions or other issues.  Tubes and ovaries were  unremarkable.  Bipolar was brought in place.  Tubes were identified by  following out to its fimbriated end.  Mid segment of each tube was cauterized for a distance of 3 cm.  Cautery was continued until the tube was completely desiccated.  We recauterized the same segment of tube again, completely desiccating the tube.  Areas of endometriosis in the cul-de-sac and on the anterior part of the uterus were also cauterized.  At the end of the procedure, both tubes were adequately cauterized.  There were no signs of injury to adjacent organs.  At this point in time, the  abdomen was deflated of carbon dioxide.  Both trocars were removed.  Subumbilical incision closed with interrupted subcuticulars of 4-0 Vicryl.  Suprapubic incision closed with Dermabond.  The Hasson cannula and single-tooth tenaculum were then removed.   The patient was taken out of dorsal lithotomy position.  Once alert and extubated, transferred to the recovery room in good condition.  VN/NUANCE  D:08/30/2019 T:08/30/2019 JOB:[id number]

## 2019-08-30 NOTE — Anesthesia Procedure Notes (Addendum)
Procedure Name: Intubation Date/Time: 08/30/2019 7:46 AM Performed by: Wanita Chamberlain, CRNA Pre-anesthesia Checklist: Timeout performed, Emergency Drugs available, Patient identified, Suction available and Patient being monitored Patient Re-evaluated:Patient Re-evaluated prior to induction Oxygen Delivery Method: Circle system utilized Preoxygenation: Pre-oxygenation with 100% oxygen Induction Type: IV induction Ventilation: Oral airway inserted - appropriate to patient size and Mask ventilation without difficulty Laryngoscope Size: Mac and 3 Grade View: Grade II Tube type: Oral Tube size: 7.0 mm Number of attempts: 1 Airway Equipment and Method: Stylet (+ cricoid manipulation) Placement Confirmation: ETT inserted through vocal cords under direct vision,  positive ETCO2,  CO2 detector and breath sounds checked- equal and bilateral Secured at: 21 cm Tube secured with: Tape Dental Injury: Teeth and Oropharynx as per pre-operative assessment

## 2019-08-30 NOTE — Anesthesia Postprocedure Evaluation (Signed)
Anesthesia Post Note  Patient: [name]  Procedure(s) Performed: LAPAROSCOPY DIAGNOSTIC (N/A Abdomen) LAPAROSCOPIC TUBAL LIGATION (Abdomen)     Patient location during evaluation: Phase II Anesthesia Type: General Level of consciousness: awake Pain management: pain level controlled Vital Signs Assessment: post-procedure vital signs reviewed and stable Respiratory status: spontaneous breathing Cardiovascular status: stable Postop Assessment: no apparent nausea or vomiting Anesthetic complications: no    Last Vitals:  Vitals:   08/30/19 0900 08/30/19 0915  BP: 113/70 107/66  Pulse: 82 80  Resp: 15 12  Temp:    SpO2: 91% 93%    Last Pain:  Vitals:   08/30/19 1000  TempSrc:   PainSc: 0-No pain   Pain Goal:                   Huston Foley

## 2019-08-30 NOTE — Anesthesia Preprocedure Evaluation (Addendum)
Anesthesia Evaluation  Patient identified by MRN, date of birth, ID band Patient awake    Reviewed: Allergy & Precautions, NPO status , Patient's Chart, lab work & pertinent test results  Airway Mallampati: II  TM Distance: >3 FB Neck ROM: Limited    Dental no notable dental hx. (+) Teeth Intact, Dental Advisory Given   Pulmonary Current Smoker and Patient abstained from smoking.,    Pulmonary exam normal breath sounds clear to auscultation       Cardiovascular negative cardio ROS Normal cardiovascular exam Rhythm:Regular Rate:Normal     Neuro/Psych  Headaches, PSYCHIATRIC DISORDERS Anxiety    GI/Hepatic negative GI ROS, Neg liver ROS,   Endo/Other  negative endocrine ROS  Renal/GU negative Renal ROS  negative genitourinary   Musculoskeletal negative musculoskeletal ROS (+)   Abdominal (+) + obese,   Peds negative pediatric ROS (+)  Hematology negative hematology ROS (+)   Anesthesia Other Findings   Reproductive/Obstetrics negative OB ROS                            Anesthesia Physical Anesthesia Plan  ASA: II  Anesthesia Plan: General   Post-op Pain Management:    Induction: Intravenous  PONV Risk Score and Plan: 4 or greater and Ondansetron, Dexamethasone and Midazolam  Airway Management Planned: Oral ETT  Additional Equipment: None  Intra-op Plan:   Post-operative Plan: Extubation in OR  Informed Consent: I have reviewed the patients History and Physical, chart, labs and discussed the procedure including the risks, benefits and alternatives for the proposed anesthesia with the patient or authorized representative who has indicated his/her understanding and acceptance.     Dental advisory given  Plan Discussed with: CRNA  Anesthesia Plan Comments:        Anesthesia Quick Evaluation

## 2019-08-30 NOTE — H&P (Signed)
  History and physical exam unchanged 

## 2019-08-30 NOTE — Brief Op Note (Signed)
08/30/2019  8:27 AM  PATIENT:  Diana James  39 y.o. female  PRE-OPERATIVE DIAGNOSIS:  pelvic pain, desire sterilization  POST-OPERATIVE DIAGNOSIS:  pelvic pain, desire sterilization  PROCEDURE:  Procedure(s): LAPAROSCOPY DIAGNOSTIC (N/A) LAPAROSCOPIC TUBAL LIGATION  SURGEON:  Surgeon(s) and Role:    * Arvella Nigh, MD - Primary  PHYSICIAN ASSISTANT:   ASSISTANTS: none   ANESTHESIA:   local and general  EBL:  15 mL   BLOOD ADMINISTERED:none  DRAINS: none   LOCAL MEDICATIONS USED:  MARCAINE     SPECIMEN:  No Specimen  DISPOSITION OF SPECIMEN:  N/A  COUNTS:  YES  TOURNIQUET:  * No tourniquets in log *  DICTATION: .Other Dictation: Dictation Number [id number]  PLAN OF CARE: Discharge to home after PACU  PATIENT DISPOSITION:  PACU - hemodynamically stable.   Delay start of Pharmacological VTE agent (>24hrs) due to surgical blood loss or risk of bleeding: not applicable

## 2019-08-31 LAB — TYPE AND SCREEN
ABO/RH(D): O POS
Antibody Screen: NEGATIVE

## 2019-10-05 ENCOUNTER — Other Ambulatory Visit: Payer: Self-pay | Admitting: Nurse Practitioner

## 2019-10-06 ENCOUNTER — Ambulatory Visit: Payer: 59

## 2021-11-20 ENCOUNTER — Emergency Department (HOSPITAL_COMMUNITY)
Admission: EM | Admit: 2021-11-20 | Discharge: 2021-11-21 | Discharge status: Against Medical Advice | Payer: Federal, State, Local not specified - PPO | Attending: Emergency Medicine | Admitting: Emergency Medicine

## 2021-11-20 ENCOUNTER — Emergency Department (HOSPITAL_COMMUNITY): Payer: Federal, State, Local not specified - PPO

## 2021-11-20 ENCOUNTER — Encounter (HOSPITAL_COMMUNITY): Payer: Self-pay | Admitting: Emergency Medicine

## 2021-11-20 ENCOUNTER — Other Ambulatory Visit: Payer: Self-pay

## 2021-11-20 DIAGNOSIS — Z5321 Procedure and treatment not carried out due to patient leaving prior to being seen by health care provider: Secondary | ICD-10-CM | POA: Diagnosis not present

## 2021-11-20 DIAGNOSIS — R1011 Right upper quadrant pain: Secondary | ICD-10-CM | POA: Diagnosis not present

## 2021-11-20 DIAGNOSIS — K760 Fatty (change of) liver, not elsewhere classified: Secondary | ICD-10-CM | POA: Diagnosis not present

## 2021-11-20 LAB — CBC WITH DIFFERENTIAL/PLATELET
Abs Immature Granulocytes: 0.09 10*3/uL — ABNORMAL HIGH (ref 0.00–0.07)
Basophils Absolute: 0.1 10*3/uL (ref 0.0–0.1)
Basophils Relative: 1 %
Eosinophils Absolute: 0.2 10*3/uL (ref 0.0–0.5)
Eosinophils Relative: 2 %
HCT: 36.9 % (ref 36.0–46.0)
Hemoglobin: 12.4 g/dL (ref 12.0–15.0)
Immature Granulocytes: 1 %
Lymphocytes Relative: 28 %
Lymphs Abs: 2.8 10*3/uL (ref 0.7–4.0)
MCH: 32.3 pg (ref 26.0–34.0)
MCHC: 33.6 g/dL (ref 30.0–36.0)
MCV: 96.1 fL (ref 80.0–100.0)
Monocytes Absolute: 0.7 10*3/uL (ref 0.1–1.0)
Monocytes Relative: 7 %
Neutro Abs: 6.1 10*3/uL (ref 1.7–7.7)
Neutrophils Relative %: 61 %
Platelets: 247 10*3/uL (ref 150–400)
RBC: 3.84 MIL/uL — ABNORMAL LOW (ref 3.87–5.11)
RDW: 13.2 % (ref 11.5–15.5)
WBC: 9.9 10*3/uL (ref 4.0–10.5)
nRBC: 0 % (ref 0.0–0.2)

## 2021-11-20 LAB — COMPREHENSIVE METABOLIC PANEL
ALT: 21 U/L (ref 0–44)
AST: 21 U/L (ref 15–41)
Albumin: 4.2 g/dL (ref 3.5–5.0)
Alkaline Phosphatase: 56 U/L (ref 38–126)
Anion gap: 8 (ref 5–15)
BUN: 11 mg/dL (ref 6–20)
CO2: 23 mmol/L (ref 22–32)
Calcium: 9.5 mg/dL (ref 8.9–10.3)
Chloride: 106 mmol/L (ref 98–111)
Creatinine, Ser: 0.79 mg/dL (ref 0.44–1.00)
GFR, Estimated: >60 mL/min (ref >60)
Glucose, Bld: 95 mg/dL (ref 70–99)
Potassium: 4 mmol/L (ref 3.5–5.1)
Sodium: 137 mmol/L (ref 135–145)
Total Bilirubin: 0.4 mg/dL (ref 0.3–1.2)
Total Protein: 7 g/dL (ref 6.5–8.1)

## 2021-11-20 LAB — URINALYSIS, ROUTINE W REFLEX MICROSCOPIC
Bilirubin Urine: NEGATIVE
Glucose, UA: NEGATIVE mg/dL
Hgb urine dipstick: NEGATIVE
Ketones, ur: NEGATIVE mg/dL
Leukocytes,Ua: NEGATIVE
Nitrite: NEGATIVE
Protein, ur: NEGATIVE mg/dL
Specific Gravity, Urine: 1.008 (ref 1.005–1.030)
pH: 5 (ref 5.0–8.0)

## 2021-11-20 LAB — LIPASE, BLOOD: Lipase: 68 U/L — ABNORMAL HIGH (ref 11–51)

## 2021-11-20 NOTE — ED Triage Notes (Signed)
Patient reports RUQ abdominal pain onset this week , no emesis , denies fever or chills , history of IBS-D.

## 2021-11-20 NOTE — ED Provider Triage Note (Signed)
Emergency Medicine Provider Triage Evaluation Note  Diana James , a 41 y.o. female  was evaluated in triage.  Pt complains of right upper quadrant abdominal pain onset 3 days acutely worsening today.  Still has her gallbladder and appendix.  Denies nausea, vomiting, fever, chills, chest pain, shortness of breath.  Patient notes that her symptoms acutely worsened today after trying to eat a bag of potato chips and she felt a sharp pain to the right upper quadrant region.  Review of Systems  Positive: As per HPI above Negative:   Physical Exam  BP 136/79   Pulse (!) 102   Temp 98.8 F (37.1 C) (Oral)   Resp 16   LMP 11/06/2021   SpO2 98%  Gen:   Awake, no distress   Resp:  Normal effort  MSK:   Moves extremities without difficulty  Other:  Right upper quadrant tenderness to palpation.  Medical Decision Making  Medically screening exam initiated at 7:22 PM.  Appropriate orders placed.  Diana James was informed that the remainder of the evaluation will be completed by another provider, this initial triage assessment does not replace that evaluation, and the importance of remaining in the ED until their evaluation is complete.  Work-up initiated   Omkar Stratmann A, PA-C 11/20/21 1933

## 2021-11-21 NOTE — ED Notes (Signed)
Pt called for vitals with no response x2 °

## 2021-11-21 NOTE — ED Notes (Signed)
Pt called for vitals with no response x3.

## 2021-11-21 NOTE — ED Notes (Signed)
Pt called for vitals with no response x1 

## 2021-12-04 ENCOUNTER — Encounter: Payer: Self-pay | Admitting: Nurse Practitioner

## 2021-12-04 ENCOUNTER — Ambulatory Visit: Payer: Federal, State, Local not specified - PPO | Admitting: Nurse Practitioner

## 2021-12-04 VITALS — BP 128/81 | HR 103 | Ht 64.0 in | Wt 220.6 lb

## 2021-12-04 DIAGNOSIS — G43119 Migraine with aura, intractable, without status migrainosus: Secondary | ICD-10-CM | POA: Diagnosis not present

## 2021-12-04 DIAGNOSIS — Z6836 Body mass index (BMI) 36.0-36.9, adult: Secondary | ICD-10-CM | POA: Diagnosis not present

## 2021-12-04 DIAGNOSIS — Z7689 Persons encountering health services in other specified circumstances: Secondary | ICD-10-CM

## 2021-12-04 DIAGNOSIS — R160 Hepatomegaly, not elsewhere classified: Secondary | ICD-10-CM

## 2021-12-04 MED ORDER — IBUPROFEN 800 MG PO TABS: 800.0000 mg | ORAL_TABLET | Freq: Three times a day (TID) | ORAL | 0 refills | Status: DC | PRN

## 2021-12-04 NOTE — Progress Notes (Signed)
New Patient Office Visit  Subjective    Patient ID: Diana James, female    DOB: [birth year]  Age: 41 y.o. MRN: [id number]  CC:  Chief Complaint  Patient presents with   New Patient (Initial Visit)    Patient was seen in in ED for abdominal pain. Patient was diagnosed with fatty liver disease.     HPI Diana James presents to establish care. Patient states she had migraine headache at present. Patient has h/o IBS, allergic rhinitis, anxiety and allergies.   Outpatient Encounter Medications as of 12/04/2021  Medication Sig   cetirizine (ZYRTEC) 10 MG tablet Take 10 mg by mouth at bedtime.    dicyclomine (BENTYL) 20 MG tablet Take 1 tablet (20 mg total) by mouth 2 (two) times daily as needed for spasms. Office visit for further refills   ibuprofen (ADVIL) 800 MG tablet Take 1 tablet (800 mg total) by mouth every 8 (eight) hours as needed.   ondansetron (ZOFRAN) 8 MG tablet Take 1 tablet (8 mg total) by mouth every 8 (eight) hours as needed for nausea or vomiting.   venlafaxine (EFFEXOR) 75 MG tablet Take 75 mg by mouth at bedtime.    [DISCONTINUED] famotidine (PEPCID) 10 MG tablet Take 10 mg by mouth 2 (two) times daily.    [DISCONTINUED] levonorgestrel (MIRENA) 20 MCG/24HR IUD 1 each by Intrauterine route once.   [DISCONTINUED] omeprazole (PRILOSEC) 40 MG capsule Take 1 capsule (40 mg total) by mouth daily. (Patient taking differently: Take 40 mg by mouth every evening. )   No facility-administered encounter medications on file as of 12/04/2021.    Past Medical History:  Diagnosis Date   Abnormal uterine bleeding (AUB)    Allergic rhinitis    Anxiety    Environmental allergies    History of colon polyps    02/ 2020   History of gastritis    02/ 2020 and esophagitis   History of migraine    History of palpitations    pt evalutated by cardiology, dr Angelena Form, note 09-20-2012 in epic;  event monitor 08-30-2012 showed NSR w/ rare PAC and echo 08-30-2012 ef 60-65% and trivial MR;    non-cardiac   IBS (irritable bowel syndrome)    IC (interstitial cystitis)    per pt no treatment or seen urologist in years   Migraine    Wears contact lenses     Past Surgical History:  Procedure Laterality Date   CESAREAN SECTION  02-27-2007; 10-07- 2010   '@[facility]    COLONOSCOPY WITH ESOPHAGOGASTRODUODENOSCOPY (EGD)  08-15-2018   dr Tarri Glenn   LAPAROSCOPIC TUBAL LIGATION  08/30/2019   Procedure: LAPAROSCOPIC TUBAL LIGATION;  Surgeon: Arvella Nigh, MD;  Location: [facility];  Service: Gynecology;;   LAPAROSCOPY N/A 08/30/2019   Procedure: LAPAROSCOPY DIAGNOSTIC;  Surgeon: Arvella Nigh, MD;  Location: [facility];  Service: Gynecology;  Laterality: N/A;   TONSILLECTOMY  2000    Family History  Problem Relation Age of Onset   Cancer - Other Father        Throat   Hypertension Mother    Hyperlipidemia Mother    Mental illness Mother        Borderline personality d/o, bipolar, manic depressive   Celiac disease Mother    Thyroid disease Mother    Diabetes Paternal Grandfather    CAD Neg Hx    Neuromuscular disorder Neg Hx     Social History   Socioeconomic History   Marital status: Married  Spouse name: Not on file   Number of children: 2   Years of education: 10   Highest education level: Not on file  Occupational History   Occupation: Insurance risk surveyor  Tobacco Use   Smoking status: Every Day    Packs/day: 0.50    Years: 25.00    Pack years: 12.50    Types: Cigarettes   Smokeless tobacco: Never  Vaping Use   Vaping Use: Former   Quit date: 02/20/2020  Substance and Sexual Activity   Alcohol use: Yes    Alcohol/week: 7.0 standard drinks    Types: 7 Standard drinks or equivalent per week    Comment: 1 drink per day   Drug use: Never   Sexual activity: Yes    Birth control/protection: I.U.D.  Other Topics Concern   Not on file  Social History Narrative   Work or School: [employer]      Home Situation: lives with  husband and two children      Spiritual Beliefs: none      Lifestyle: no regular exercise; diet is terrible      Right-handed      Caffeine: coffee, tea and sodas throughout the day      Social Determinants of Health   Financial Resource Strain: Not on file  Food Insecurity: Not on file  Transportation Needs: Not on file  Physical Activity: Not on file  Stress: Not on file  Social Connections: Not on file  Intimate Partner Violence: Not on file    Review of Systems  Constitutional:  Negative for chills and fever.  HENT:  Negative for ear discharge, hearing loss and sinus pain.   Eyes:  Positive for photophobia. Negative for blurred vision and discharge.  Respiratory:  Negative for cough, sputum production and shortness of breath.   Cardiovascular:  Negative for chest pain, orthopnea and leg swelling.  Gastrointestinal:  Negative for abdominal pain and heartburn.  Genitourinary:  Negative for dysuria and frequency.  Musculoskeletal:  Negative for back pain and myalgias.  Skin:  Negative for itching and rash.  Neurological:  Positive for headaches. Negative for dizziness, tingling, tremors and sensory change.  Psychiatric/Behavioral:  Negative for depression, substance abuse and suicidal ideas.        Objective    BP 128/81   Pulse (!) 103   Ht '5\' 4"'$  (1.626 m)   Wt 220 lb 9.6 oz (100.1 kg)   LMP 11/06/2021   BMI 37.87 kg/m   Physical Exam Constitutional:      Appearance: Normal appearance. She is obese.  HENT:     Head: Normocephalic and atraumatic.     Right Ear: Tympanic membrane normal.     Left Ear: Tympanic membrane normal.     Nose: Nose normal.     Mouth/Throat:     Mouth: Mucous membranes are moist.     Pharynx: Oropharynx is clear.  Eyes:     Extraocular Movements: Extraocular movements intact.     Pupils: Pupils are equal, round, and reactive to light.  Cardiovascular:     Rate and Rhythm: Regular rhythm. Tachycardia present.     Pulses: Normal  pulses.     Heart sounds: Normal heart sounds.  Pulmonary:     Effort: Pulmonary effort is normal.     Breath sounds: Normal breath sounds.  Abdominal:     General: Bowel sounds are normal.     Palpations: Abdomen is soft.  Musculoskeletal:  General: No swelling or deformity. Normal range of motion.     Cervical back: Normal range of motion.  Skin:    General: Skin is warm.     Capillary Refill: Capillary refill takes less than 2 seconds.     Coloration: Skin is not jaundiced or pale.  Neurological:     General: No focal deficit present.     Mental Status: She is alert and oriented to person, place, and time. Mental status is at baseline.  Psychiatric:        Mood and Affect: Mood normal.        Behavior: Behavior normal.        Thought Content: Thought content normal.        Judgment: Judgment normal.        Assessment & Plan:   Problem List Items Addressed This Visit       Cardiovascular and Mediastinum   Intractable migraine with aura without status migrainosus    Started patient on ibuprofen 800 mg as needed for headache. Advised patient to perform breathing exercises and meditation.          Relevant Medications   ibuprofen (ADVIL) 800 MG tablet     Digestive   Enlarged liver    Advised patient to watch diet and perform regular physical activity. Would refer her to GI       Relevant Orders   Ambulatory referral to Gastroenterology   Lipase   Lipid panel     Other   BMI 36.0-36.9,adult    BMI 37.87 in the office today. Advised pt to lose weight. Advised patient to avoid trans fat, fatty and fried food. Follow a regular physical activity schedule. Went over the risk of chronic diseases with increased weight.           Establishing care with new doctor, encounter for - Primary    Care established. Labs ordered.       Relevant Orders   TSH    No follow-ups on file.   Theresia Lo, NP

## 2021-12-06 DIAGNOSIS — Z7689 Persons encountering health services in other specified circumstances: Secondary | ICD-10-CM | POA: Insufficient documentation

## 2021-12-06 DIAGNOSIS — G43119 Migraine with aura, intractable, without status migrainosus: Secondary | ICD-10-CM | POA: Insufficient documentation

## 2021-12-06 NOTE — Assessment & Plan Note (Signed)
Started patient on ibuprofen 800 mg as needed for headache. Advised patient to perform breathing exercises and meditation.

## 2021-12-06 NOTE — Assessment & Plan Note (Signed)
BMI 37.87 in the office today. Advised pt to lose weight. Advised patient to avoid trans fat, fatty and fried food. Follow a regular physical activity schedule. Went over the risk of chronic diseases with increased weight.

## 2021-12-06 NOTE — Assessment & Plan Note (Signed)
Care established. Labs ordered. 

## 2021-12-06 NOTE — Assessment & Plan Note (Signed)
Advised patient to watch diet and perform regular physical activity. Would refer her to GI

## 2021-12-07 DIAGNOSIS — R160 Hepatomegaly, not elsewhere classified: Secondary | ICD-10-CM | POA: Diagnosis not present

## 2021-12-08 ENCOUNTER — Encounter: Payer: Self-pay | Admitting: Nurse Practitioner

## 2021-12-08 LAB — LIPASE: Lipase: 133 U/L — ABNORMAL HIGH (ref 14–72)

## 2021-12-11 ENCOUNTER — Ambulatory Visit: Payer: Federal, State, Local not specified - PPO | Admitting: Gastroenterology

## 2021-12-11 ENCOUNTER — Encounter: Payer: Self-pay | Admitting: Gastroenterology

## 2021-12-11 VITALS — BP 118/70 | HR 95 | Ht 64.0 in | Wt 217.0 lb

## 2021-12-11 DIAGNOSIS — K824 Cholesterolosis of gallbladder: Secondary | ICD-10-CM

## 2021-12-11 DIAGNOSIS — R748 Abnormal levels of other serum enzymes: Secondary | ICD-10-CM | POA: Diagnosis not present

## 2021-12-11 DIAGNOSIS — R160 Hepatomegaly, not elsewhere classified: Secondary | ICD-10-CM | POA: Diagnosis not present

## 2021-12-11 MED ORDER — PANTOPRAZOLE SODIUM 40 MG PO TBEC: 40.0000 mg | DELAYED_RELEASE_TABLET | Freq: Every day | ORAL | 3 refills | Status: DC

## 2021-12-11 NOTE — Progress Notes (Signed)
Referring Provider: Theresia Lo, NP Primary Care Physician:  Theresia Lo, NP  Reason for Consultation:     IMPRESSION:  Right upper quadrant pain.  Now resolved.  She has a history of esophagitis, gastritis, and duodenitis.  Must consider alcohol induced gastritis as her symptoms have largely improved since she stopped drinking alcohol.  No gallstones on ultrasound.  Pancreas appears normal.  Avoid NSAIDs.  Hepatomegaly.  Seen on ultrasound.  Liver enzymes are normal.  Not seen on prior ultrasound in 2019.  Will further evaluate with MRI.  May need to proceed with serologic evaluation for common causes of hepatomegaly with normal liver enzymes.  Elevated lipase.  Lipase was normal when seen in the ER.  It is increased since that time.  However, she is currently asymptomatic.  Will repeat and plan additional pancreatic imaging.  Gallbladder polyp.  Appears slightly larger on recent imaging.  However its unclear if this increase in size is clinically significant.  Will reassess on upcoming MRI.  May need to consider serial imaging to document stability.  Mother with colon polyps.  The patient has a history of hyperplastic polyps removed on colonoscopy in 2022.  Given her family history I recommend repeating the colonoscopy in 5 years.  History of IBS-D.  Endoscopic evaluation including normal colon biopsies 2022.  Continue Bentyl.  PLAN: -Resume pantoprazole 40 mg QAM - CMP with amylase and lipase next week - Pancreatic protocol MRI - HIDA with CCK to evaluate symptomatic gallbladder disease as the cause of her recent acute right upper quadrant pain    HPI: Diana James is a 41 y.o. female referred by NP [facility] for further evaluation of an abnormal liver.  The history is obtained through the patient, review of her electronic health record, and records provided by the referring provider.  She has a history of allergic rhinitis, anxiety, allergies, and IBS-d.  She was last seen  in 2020 by [facility] Going for crampy diarrhea, intermittent rectal bleeding, and reflux. She works in [employer] for the [employer].   Seen in an Urgent Care 11/20/21 for one week of severe RUQ abdominal pain. Had noted some oily film in the toilet bowl at that time but no other associated change in bowel habits.  No systemic complaints.  No identified triggering symptoms.  No identified exacerbating or relieving features.  Abdominal ultrasound showed a large liver and a gallbladder polyps  She left the ER prior to any recommendations.  The pain resolved when she stopped drinking alcohol and removed fat, soda, sugar, and greasy fatty foods from her diet.  Prior to that time she was drinking alcohol on a daily basis.  There is no weight loss, blood in the stool, nocturnal symptoms.  She was recently given ibuprofen for migraines but she doesn't use it because she doesn't find it helpful.   Abdominal ultrasound 11/20/2021 showed an enlarged liver with increased hepatic echogenicity and a 5 mm gallbladder polyp.  An ultrasound in 2019 did not show obvious fatty liver.  There was a 4 mm gallbladder polyp at that time.  Labs 11/20/21 CMP with an elevated 68 Labs 12/07/21 CMP with an elevated at 133  She is under significant stress from a 4  The scopic history: EGD showed LA grade a reflux esophagitis, gastritis, and duodenitis.  Colonoscopy 08/15/2020 showed a 4 mm descending colon polyp, a 3 mm sigmoid colon polyp, and was otherwise normal with random colon biopsies obtained to exclude microscopic colitis.   Pathology  results showed hyperplastic polyps, normal colon biopsies, reflux, gastritis, and duodenal mucosa with mild peptic injury.  There was no H. pylori.   Past Medical History:  Diagnosis Date   Abnormal uterine bleeding (AUB)    Allergic rhinitis    Anal fissure    Anxiety    Chronic headaches    Colon polyps    Environmental allergies    GERD (gastroesophageal reflux disease)    History of  colon polyps    02/ 2020   History of gastritis    02/ 2020 and esophagitis   History of migraine    History of palpitations    pt evalutated by cardiology, dr Angelena Form, note 09-20-2012 in epic;  event monitor 08-30-2012 showed NSR w/ rare PAC and echo 08-30-2012 ef 60-65% and trivial MR;   non-cardiac   Hypertension    IBS (irritable bowel syndrome)    IC (interstitial cystitis)    per pt no treatment or seen urologist in years   Migraine    Wears contact lenses     Past Surgical History:  Procedure Laterality Date   CESAREAN SECTION  02-27-2007; 10-07- 2010   '@[facility]    COLONOSCOPY WITH ESOPHAGOGASTRODUODENOSCOPY (EGD)  08-15-2018   dr Tarri Glenn   LAPAROSCOPIC TUBAL LIGATION  08/30/2019   Procedure: LAPAROSCOPIC TUBAL LIGATION;  Surgeon: Arvella Nigh, MD;  Location: [facility];  Service: Gynecology;;   LAPAROSCOPY N/A 08/30/2019   Procedure: LAPAROSCOPY DIAGNOSTIC;  Surgeon: Arvella Nigh, MD;  Location: [facility];  Service: Gynecology;  Laterality: N/A;   TONSILLECTOMY  2000      Current Outpatient Medications  Medication Sig Dispense Refill   cetirizine (ZYRTEC) 10 MG tablet Take 10 mg by mouth at bedtime.      dicyclomine (BENTYL) 20 MG tablet Take 1 tablet (20 mg total) by mouth 2 (two) times daily as needed for spasms. Office visit for further refills 60 tablet 1   ibuprofen (ADVIL) 800 MG tablet Take 1 tablet (800 mg total) by mouth every 8 (eight) hours as needed. 30 tablet 0   ondansetron (ZOFRAN) 8 MG tablet Take 1 tablet (8 mg total) by mouth every 8 (eight) hours as needed for nausea or vomiting. 90 tablet 0   venlafaxine (EFFEXOR) 75 MG tablet Take 75 mg by mouth at bedtime.      No current facility-administered medications for this visit.    Allergies as of 12/11/2021 - Review Complete 12/11/2021  Allergen Reaction Noted   Cefdinir Hives 02/07/2014   Penicillins Hives 01/13/2018    Family History  Problem Relation Age of Onset    Hypertension Mother    Hyperlipidemia Mother    Mental illness Mother        Borderline personality d/o, bipolar, manic depressive   Celiac disease Mother    Thyroid disease Mother    Colon polyps Mother    Diabetes Mother    Irritable bowel syndrome Mother    Cancer - Other Father        Throat   Diabetes Paternal Grandfather    CAD Neg Hx    Neuromuscular disorder Neg Hx    Stomach cancer Neg Hx    Esophageal cancer Neg Hx     Social History   Socioeconomic History   Marital status: Married    Spouse name: Not on file   Number of children: 2   Years of education: 10   Highest education level: Not on file  Occupational History   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  Other Topics Concern   Not on file  Social History Narrative   Work or School: [employer]      Home Situation: lives with husband and two children      Spiritual Beliefs: none      Lifestyle: no regular exercise; diet is terrible      Right-handed      Caffeine: coffee, tea and sodas throughout the day      Social Determinants of Health   Financial Resource Strain: Not on file  Food Insecurity: Not on file  Transportation Needs: Not on file  Physical Activity: Not on file  Stress: Not on file  Social Connections: Not on file  Intimate Partner Violence: Not on file    Review of Systems: 12 system ROS is negative except as noted above.   Physical Exam: General:   Alert,   well-nourished, pleasant and cooperative in NAD Head:  Normocephalic and atraumatic. Eyes:  Sclera clear, no icterus.   Conjunctiva pink. Ears:  Normal auditory acuity. Nose:  No deformity, discharge,  or lesions. Mouth:  No deformity or lesions.   Neck:  Supple; no masses or thyromegaly. Lungs:  Clear throughout to auscultation.   No wheezes. Heart:  Regular rate and rhythm; no murmurs. Abdomen:  Soft, nontender, nondistended, normal bowel sounds, no rebound or guarding. No hepatosplenomegaly.   Rectal:  Deferred  Msk:  Symmetrical. No boney deformities LAD: No inguinal or umbilical LAD Extremities:  No clubbing or edema. Neurologic:  Alert and  oriented x4;  grossly nonfocal Skin:  Intact without significant lesions or rashes. Psych:  Alert and cooperative. Normal mood and affect.    Kenyatte Chatmon L. Tarri Glenn, MD, MPH 12/11/2021, 4:15 PM

## 2021-12-11 NOTE — Patient Instructions (Addendum)
It was my pleasure to provide care to you today. Based on our discussion, I am providing you with my recommendations below:  RECOMMENDATION(S):   Start Pantoprazole 40 mg daily 30-60 minutes before breakfast.   LABS:   Please proceed to the basement level for lab work before leaving today. Press "B" on the elevator. The lab is located at the first door on the left as you exit the elevator.  IMAGING:  You will be contacted by [facility] (Your caller ID will indicate phone # [phone]) within the next business 7-10 business days to schedule your MRI & Ultrasound. If you have not heard from them within 7-10 business days, please call [facility] at [phone] to follow up on the status of your appointment.    FOLLOW UP:  After your procedure, you will receive a call from my office staff regarding my recommendation for follow up.  BMI:  If you are age 24 or older, your body mass index should be between 23-30. Your Body mass index is 37.25 kg/m. If this is out of the aforementioned range listed, please consider follow up with your Primary Care Provider.  If you are age 44 or younger, your body mass index should be between 19-25. Your Body mass index is 37.25 kg/m. If this is out of the aformentioned range listed, please consider follow up with your Primary Care Provider.   MY CHART:  The [facility] providers would like to encourage you to use [facility] to communicate with providers for non-urgent requests or questions.  Due to long hold times on the telephone, sending your provider a message by [facility] may be a faster and more efficient way to get a response.  Please allow 48 business hours for a response.  Please remember that this is for non-urgent requests.   Thank you for trusting me with your gastrointestinal care!    Thornton Park, MD, MPH

## 2021-12-15 ENCOUNTER — Other Ambulatory Visit: Payer: Self-pay | Admitting: *Deleted

## 2021-12-15 ENCOUNTER — Other Ambulatory Visit (INDEPENDENT_AMBULATORY_CARE_PROVIDER_SITE_OTHER): Payer: Federal, State, Local not specified - PPO

## 2021-12-15 DIAGNOSIS — R160 Hepatomegaly, not elsewhere classified: Secondary | ICD-10-CM | POA: Diagnosis not present

## 2021-12-15 DIAGNOSIS — R748 Abnormal levels of other serum enzymes: Secondary | ICD-10-CM

## 2021-12-15 LAB — COMPREHENSIVE METABOLIC PANEL
ALT: 16 U/L (ref 0–35)
AST: 13 U/L (ref 0–37)
Albumin: 4.5 g/dL (ref 3.5–5.2)
Alkaline Phosphatase: 56 U/L (ref 39–117)
BUN: 13 mg/dL (ref 6–23)
CO2: 26 mEq/L (ref 19–32)
Calcium: 9.8 mg/dL (ref 8.4–10.5)
Chloride: 102 mEq/L (ref 96–112)
Creatinine, Ser: 0.78 mg/dL (ref 0.40–1.20)
GFR: 94.29 mL/min (ref >60.00)
Glucose, Bld: 124 mg/dL — ABNORMAL HIGH (ref 70–99)
Potassium: 3.8 mEq/L (ref 3.5–5.1)
Sodium: 137 mEq/L (ref 135–145)
Total Bilirubin: 0.2 mg/dL (ref 0.2–1.2)
Total Protein: 7.3 g/dL (ref 6.0–8.3)

## 2021-12-15 LAB — AMYLASE: Amylase: 52 U/L (ref 27–131)

## 2021-12-15 LAB — LIPASE: Lipase: 119 U/L — ABNORMAL HIGH (ref 11.0–59.0)

## 2021-12-16 ENCOUNTER — Other Ambulatory Visit: Payer: Self-pay

## 2021-12-16 DIAGNOSIS — R748 Abnormal levels of other serum enzymes: Secondary | ICD-10-CM

## 2021-12-29 ENCOUNTER — Encounter: Payer: Self-pay | Admitting: Gastroenterology

## 2021-12-30 ENCOUNTER — Encounter: Payer: Self-pay | Admitting: Gastroenterology

## 2021-12-30 ENCOUNTER — Ambulatory Visit (HOSPITAL_COMMUNITY)
Admission: RE | Admit: 2021-12-30 | Discharge: 2021-12-30 | Disposition: A | Discharge status: Home / Self Care | Payer: Federal, State, Local not specified - PPO | Source: Ambulatory Visit | Attending: Gastroenterology | Admitting: Gastroenterology

## 2021-12-30 ENCOUNTER — Other Ambulatory Visit: Payer: Self-pay | Admitting: Gastroenterology

## 2021-12-30 DIAGNOSIS — R160 Hepatomegaly, not elsewhere classified: Secondary | ICD-10-CM | POA: Diagnosis not present

## 2021-12-30 DIAGNOSIS — K824 Cholesterolosis of gallbladder: Secondary | ICD-10-CM | POA: Diagnosis not present

## 2021-12-30 DIAGNOSIS — R935 Abnormal findings on diagnostic imaging of other abdominal regions, including retroperitoneum: Secondary | ICD-10-CM | POA: Diagnosis not present

## 2021-12-30 MED ORDER — GADOBUTROL 1 MMOL/ML IV SOLN
10.0000 mL | Freq: Once | INTRAVENOUS | Status: AC | PRN
  Administered 2021-12-30: 10 mL via INTRAVENOUS

## 2021-12-31 ENCOUNTER — Encounter: Payer: Self-pay | Admitting: Gastroenterology

## 2022-01-06 ENCOUNTER — Ambulatory Visit (HOSPITAL_COMMUNITY)
Admission: RE | Admit: 2022-01-06 | Discharge: 2022-01-06 | Disposition: A | Discharge status: Home / Self Care | Payer: Federal, State, Local not specified - PPO | Source: Ambulatory Visit | Attending: Gastroenterology | Admitting: Gastroenterology

## 2022-01-06 DIAGNOSIS — R748 Abnormal levels of other serum enzymes: Secondary | ICD-10-CM | POA: Insufficient documentation

## 2022-01-06 DIAGNOSIS — K824 Cholesterolosis of gallbladder: Secondary | ICD-10-CM | POA: Diagnosis not present

## 2022-01-06 DIAGNOSIS — K219 Gastro-esophageal reflux disease without esophagitis: Secondary | ICD-10-CM | POA: Diagnosis not present

## 2022-01-06 MED ORDER — TECHNETIUM TC 99M MEBROFENIN IV KIT
5.3000 | PACK | Freq: Once | INTRAVENOUS | Status: AC | PRN
  Administered 2022-01-06: 5.3 via INTRAVENOUS

## 2022-01-08 ENCOUNTER — Other Ambulatory Visit (INDEPENDENT_AMBULATORY_CARE_PROVIDER_SITE_OTHER): Payer: Federal, State, Local not specified - PPO

## 2022-01-08 ENCOUNTER — Other Ambulatory Visit: Payer: Self-pay

## 2022-01-08 DIAGNOSIS — R748 Abnormal levels of other serum enzymes: Secondary | ICD-10-CM | POA: Diagnosis not present

## 2022-01-08 LAB — LIPASE: Lipase: 99 U/L — ABNORMAL HIGH (ref 11.0–59.0)

## 2022-01-08 MED ORDER — SUCRALFATE 1 GM/10ML PO SUSP: 1.0000 g | Freq: Four times a day (QID) | ORAL | 0 refills | Status: DC

## 2022-02-11 ENCOUNTER — Encounter: Payer: Self-pay | Admitting: Gastroenterology

## 2022-02-11 ENCOUNTER — Ambulatory Visit: Payer: Federal, State, Local not specified - PPO | Admitting: Gastroenterology

## 2022-02-11 VITALS — BP 120/74 | HR 97 | Ht 64.0 in | Wt 214.6 lb

## 2022-02-11 DIAGNOSIS — K5900 Constipation, unspecified: Secondary | ICD-10-CM | POA: Diagnosis not present

## 2022-02-11 DIAGNOSIS — R160 Hepatomegaly, not elsewhere classified: Secondary | ICD-10-CM | POA: Diagnosis not present

## 2022-02-11 NOTE — Patient Instructions (Addendum)
I recommend that you eat at least 25-30 grams of fiber daily and drink at least 64 ounces of water daily. You will want to gradually increase the fiber in your diet to avoid bloating. You may increase the fiber through diet and through fiber supplements including psyllium and methycellulose.   Natural laxatives include prunes, apples, apricots, cherries, peaches, pears, aloe, rhubarb, kiwi, bananas, mango, papaya, and watermelon. In particular, two kiwi a day has been show to cause less likely to cause bloating than prunes or psyllium.  Today, I would recommend using a Fleets Enema and/or Miralax to help relieve your constipation. If the constipation persists, we would consider different strategies. Keep me posted.   I have recommended a liver biopsy and additional labs.   I'd like to see you back in the office after the biopsy to discuss the results.    IMAGING:  You will be contacted by [facility] (Your caller ID will indicate phone # [phone]) within the next business 7-10 business days to schedule your Liver Biopsy. If you have not heard from them within 7-10 business days, please call [facility] at [phone] to follow up on the status of your appointment.

## 2022-02-11 NOTE — Progress Notes (Signed)
Referring Provider: Theresia Lo, NP Primary Care Physician:  Theresia Lo, NP  Reason for Consultation:  Abnormal liver imaging   IMPRESSION:  Hepatomegaly and mildly enlarged spleen.  Normal liver enzymes. Seen on ultrasound and MRI with fat seen on MRI.  Not seen on prior ultrasound in 2019.  Proceed with liver biopsy and serologic evaluation for common causes of hepatomegaly with normal liver enzymes.  Elevated lipase.  Persistently elevated despite resolution of abdominal pain. However, she is currently asymptomatic. No obvious unifying diagnosis with history of hepatomegaly and mildly enlarged spleen.  Will repeat and plan additional pancreatic imaging.  Gallbladder polyp.  Reassured by MRI.  Right upper quadrant pain.  Now resolved.  She has a history of esophagitis, gastritis, and duodenitis.  Recent HIDA scan also shows bile acid reflux. She did not tolerate a trial of Carafate.   Mother with colon polyps.  The patient has a history of hyperplastic polyps removed on colonoscopy in 2022.  Given her family history I recommend repeating the colonoscopy in 5 years.  History of IBS-D now with constipation.  Endoscopic evaluation including normal colon biopsies 2022.  Continue Bentyl. Dietary recommendations as noted in patient instructions.   PLAN: - Continue pantoprazole 40 mg QAM - Evaluate for genotype 3 HCV, alpha-one-antitrypsin level, and ceruloplasmin level and consider screening for insulin resistance - Liver biopsy in IR - Consider EUS versus pancreatic protocol CT if lipase does not normalize - Supportive care for constipation (see patient instructions for dietary recommendations) - Office follow-up 7-10 days after the biopsies    HPI: Diana James is a 41 y.o. female who returns in follow-up.  She was last seen for RUQ pain 12/11/21.  The interval history is obtained through the patient and review of her electronic health record.  Seen in an Urgent Care  11/20/21 for one week of severe RUQ abdominal pain. Had noted some oily film in the toilet bowl at that time but no other associated change in bowel habits.  No systemic complaints.  No identified triggering symptoms.  No identified exacerbating or relieving features.  Abdominal ultrasound showed a large liver and a gallbladder polyps  She left the ER prior to any recommendations.  The pain resolved when she stopped drinking alcohol and removed fat, soda, sugar, and greasy fatty foods from her diet.  Prior to that time she was drinking alcohol on a daily basis. There is no weight loss, blood in the stool, nocturnal symptoms.   Abdominal ultrasound 11/20/2021 showed an enlarged liver with increased hepatic echogenicity and a 5 mm gallbladder polyp.  An ultrasound in 2019 did not show obvious fatty liver.  There was a 4 mm gallbladder polyp at that time.  Labs 11/20/21 CMP with an elevated 68 Labs 12/07/21 CMP with an elevated at 133 Labs 12/15/21 CMP normal, lipase 119, amylase 52 Labs 01/08/22: lipase 99  HIDA with CCK 01/06/22: Normal with GBEF 71%, evidence of enterogastric biliary reflux  Pancreatic protocol MRI 12/30/21: Hepatomegaly measuring 21.6 cm, spleen 12.5 cm, no acute findings, small gallbladder polyp  Now with severe constipation. Only a few small bowel movements last week.  No new medications. She stopped eating junk and carbonated sodas.  She has been on Protonix. She did not tolerate Carafate slurry.    Endoscopic history: EGD showed LA grade a reflux esophagitis, gastritis, and duodenitis.  Colonoscopy 08/15/2020 showed a 4 mm descending colon polyp, a 3 mm sigmoid colon polyp, and was otherwise normal with random colon  biopsies obtained to exclude microscopic colitis.   Pathology results showed hyperplastic polyps, normal colon biopsies, reflux, gastritis, and duodenal mucosa with mild peptic injury.  There was no H. pylori.   Past Medical History:  Diagnosis Date   Abnormal  uterine bleeding (AUB)    Allergic rhinitis    Anal fissure    Anxiety    Chronic headaches    Colon polyps    Environmental allergies    GERD (gastroesophageal reflux disease)    History of colon polyps    02/ 2020   History of gastritis    02/ 2020 and esophagitis   History of migraine    History of palpitations    pt evalutated by cardiology, dr Angelena Form, note 09-20-2012 in epic;  event monitor 08-30-2012 showed NSR w/ rare PAC and echo 08-30-2012 ef 60-65% and trivial MR;   non-cardiac   Hypertension    IBS (irritable bowel syndrome)    IC (interstitial cystitis)    per pt no treatment or seen urologist in years   Migraine    Wears contact lenses     Past Surgical History:  Procedure Laterality Date   CESAREAN SECTION  02-27-2007; 10-07- 2010   '@[facility]    COLONOSCOPY WITH ESOPHAGOGASTRODUODENOSCOPY (EGD)  08-15-2018   dr Tarri Glenn   LAPAROSCOPIC TUBAL LIGATION  08/30/2019   Procedure: LAPAROSCOPIC TUBAL LIGATION;  Surgeon: Arvella Nigh, MD;  Location: [facility];  Service: Gynecology;;   LAPAROSCOPY N/A 08/30/2019   Procedure: LAPAROSCOPY DIAGNOSTIC;  Surgeon: Arvella Nigh, MD;  Location: [place];  Service: Gynecology;  Laterality: N/A;   TONSILLECTOMY  2000      Current Outpatient Medications  Medication Sig Dispense Refill   cetirizine (ZYRTEC) 10 MG tablet Take 10 mg by mouth at bedtime.      dicyclomine (BENTYL) 20 MG tablet Take 1 tablet (20 mg total) by mouth 2 (two) times daily as needed for spasms. Office visit for further refills 60 tablet 1   ibuprofen (ADVIL) 800 MG tablet Take 1 tablet (800 mg total) by mouth every 8 (eight) hours as needed. 30 tablet 0   ondansetron (ZOFRAN) 8 MG tablet Take 1 tablet (8 mg total) by mouth every 8 (eight) hours as needed for nausea or vomiting. 90 tablet 0   pantoprazole (PROTONIX) 40 MG tablet Take 1 tablet (40 mg total) by mouth daily. 90 tablet 3   venlafaxine (EFFEXOR) 75 MG tablet Take 75 mg by  mouth at bedtime.      sucralfate (CARAFATE) 1 GM/10ML suspension Take 10 mLs (1 g total) by mouth 4 (four) times daily for 14 days. 560 mL 0   No current facility-administered medications for this visit.    Allergies as of 02/11/2022 - Review Complete 02/11/2022  Allergen Reaction Noted   Cefdinir Hives 02/07/2014   Penicillins Hives 01/13/2018    Family History  Problem Relation Age of Onset   Hypertension Mother    Hyperlipidemia Mother    Mental illness Mother        Borderline personality d/o, bipolar, manic depressive   Celiac disease Mother    Thyroid disease Mother    Colon polyps Mother    Diabetes Mother    Irritable bowel syndrome Mother    Cancer - Other Father        Throat   Diabetes Paternal Grandfather    CAD Neg Hx    Neuromuscular disorder Neg Hx    Stomach cancer Neg Hx  Esophageal cancer Neg Hx     Social History   Socioeconomic History   Marital status: Married    Spouse name: Not on file   Number of children: 2   Years of education: 10   Highest education level: Not on file  Occupational History   Occupation: Insurance risk surveyor   Occupation: human resources speacialist  Tobacco Use   Smoking status: Every Day    Packs/day: 0.50    Years: 25.00    Total pack years: 12.50    Types: Cigarettes   Smokeless tobacco: Never  Vaping Use   Vaping Use: Former   Quit date: 02/20/2020  Substance and Sexual Activity   Alcohol use: Yes    Alcohol/week: 7.0 standard drinks of alcohol    Types: 7 Standard drinks or equivalent per week    Comment: 1 drink per day   Drug use: Never   Sexual activity: Yes    Birth control/protection: I.U.D.  Other Topics Concern   Not on file  Social History Narrative   Work or School: [employer]      Home Situation: lives with husband and two children      Spiritual Beliefs: none      Lifestyle: no regular exercise; diet is terrible      Right-handed      Caffeine: coffee, tea and sodas  throughout the day      Social Determinants of Health   Financial Resource Strain: Not on file  Food Insecurity: Not on file  Transportation Needs: Not on file  Physical Activity: Not on file  Stress: Not on file  Social Connections: Not on file  Intimate Partner Violence: Not on file    Review of Systems: 12 system ROS is negative except as noted above.   Physical Exam: General:   Alert,  well-nourished, pleasant and cooperative in NAD Head:  Normocephalic and atraumatic. Eyes:  Sclera clear, no icterus.   Conjunctiva pink. Ears:  Normal auditory acuity. Nose:  No deformity, discharge,  or lesions. Mouth:  No deformity or lesions.   Neck:  Supple; no masses or thyromegaly. Lungs:  Clear throughout to auscultation.   No wheezes. Heart:  Regular rate and rhythm; no murmurs. Abdomen:  Soft, nontender, nondistended, normal bowel sounds, no rebound or guarding. No hepatosplenomegaly.   Rectal:  Deferred  Msk:  Symmetrical. No boney deformities LAD: No inguinal or umbilical LAD Extremities:  No clubbing or edema. Neurologic:  Alert and  oriented x4;  grossly nonfocal Skin:  Intact without significant lesions or rashes. Psych:  Alert and cooperative. Normal mood and affect.    Jayd Forrey L. Tarri Glenn, MD, MPH 02/11/2022, 4:01 PM

## 2022-02-17 ENCOUNTER — Encounter: Payer: Self-pay | Admitting: Gastroenterology

## 2022-02-26 ENCOUNTER — Other Ambulatory Visit: Payer: Self-pay | Admitting: Radiology

## 2022-02-26 DIAGNOSIS — R160 Hepatomegaly, not elsewhere classified: Secondary | ICD-10-CM

## 2022-02-28 ENCOUNTER — Other Ambulatory Visit: Payer: Self-pay | Admitting: Student

## 2022-02-28 NOTE — H&P (Signed)
Chief Complaint: Patient was seen in consultation today for hepatomegaly with normal liver enzymes at the request of Tressia Danas  Referring Physician(s): Tressia Danas Supervising Physician: Irish Lack  Patient Status: [facility] - Out-pt  History of Present Illness: Diana James is a 41 y.o. female with past medical history of colon polyps, esophagitis, gastritis and duodenitis.  Patient presented to ED 11/20/2021 complaining of severe right upper quadrant abdominal pain and only stools.  Ultrasound at that time showed large liver and gallbladder polyps.  Patient was referred to gastroenterology and was found to have hepatomegaly and splenomegaly.  Patient's recent labs have been within normal limits.  Patient has been referred to IR for random liver biopsy by Tressia Danas, MD.  Past Medical History:  Diagnosis Date   Abnormal uterine bleeding (AUB)    Allergic rhinitis    Anal fissure    Anxiety    Chronic headaches    Colon polyps    Environmental allergies    GERD (gastroesophageal reflux disease)    History of colon polyps    02/ 2020   History of gastritis    02/ 2020 and esophagitis   History of migraine    History of palpitations    pt evalutated by cardiology, dr Clifton James, note 09-20-2012 in epic;  event monitor 08-30-2012 showed NSR w/ rare PAC and echo 08-30-2012 ef 60-65% and trivial MR;   non-cardiac   Hypertension    IBS (irritable bowel syndrome)    IC (interstitial cystitis)    per pt no treatment or seen urologist in years   Migraine    Wears contact lenses     Past Surgical History:  Procedure Laterality Date   CESAREAN SECTION  02-27-2007; 10-07- 2010   @[facility]    COLONOSCOPY WITH ESOPHAGOGASTRODUODENOSCOPY (EGD)  08-15-2018   dr Orvan Falconer   LAPAROSCOPIC TUBAL LIGATION  08/30/2019   Procedure: LAPAROSCOPIC TUBAL LIGATION;  Surgeon: Richardean Chimera, MD;  Location: [facility];  Service: Gynecology;;   LAPAROSCOPY N/A 08/30/2019    Procedure: LAPAROSCOPY DIAGNOSTIC;  Surgeon: Richardean Chimera, MD;  Location: [facility];  Service: Gynecology;  Laterality: N/A;   TONSILLECTOMY  2000    Allergies: Cefdinir and Penicillins  Medications: Prior to Admission medications   Medication Sig Start Date End Date Taking? Authorizing Provider  cetirizine (ZYRTEC) 10 MG tablet Take 10 mg by mouth at bedtime.     [provider]  dicyclomine (BENTYL) 20 MG tablet Take 1 tablet (20 mg total) by mouth 2 (two) times daily as needed for spasms. Office visit for further refills 10/08/19   Meredith Pel, NP  ibuprofen (ADVIL) 800 MG tablet Take 1 tablet (800 mg total) by mouth every 8 (eight) hours as needed. 12/04/21   Kara Dies, NP  ondansetron (ZOFRAN) 8 MG tablet Take 1 tablet (8 mg total) by mouth every 8 (eight) hours as needed for nausea or vomiting. 11/17/18   Koberlein, Paris Lore, MD  pantoprazole (PROTONIX) 40 MG tablet Take 1 tablet (40 mg total) by mouth daily. 12/11/21   Tressia Danas, MD  sucralfate (CARAFATE) 1 GM/10ML suspension Take 10 mLs (1 g total) by mouth 4 (four) times daily for 14 days. 01/08/22 01/22/22  Tressia Danas, MD  venlafaxine (EFFEXOR) 75 MG tablet Take 75 mg by mouth at bedtime.     [provider]     Family History  Problem Relation Age of Onset   Hypertension Mother    Hyperlipidemia Mother  Mental illness Mother        Borderline personality d/o, bipolar, manic depressive   Celiac disease Mother    Thyroid disease Mother    Colon polyps Mother    Diabetes Mother    Irritable bowel syndrome Mother    Cancer - Other Father        Throat   Diabetes Paternal Grandfather    CAD Neg Hx    Neuromuscular disorder Neg Hx    Stomach cancer Neg Hx    Esophageal cancer Neg Hx     Social History   Socioeconomic History   Marital status: Married    Spouse name: Not on file   Number of children: 2   Years of education: 10   Highest education level: Not on  file  Occupational History   Occupation: Herbalist   Occupation: Engineer, drilling  Tobacco Use   Smoking status: Every Day    Packs/day: 0.50    Years: 25.00    Total pack years: 12.50    Types: Cigarettes   Smokeless tobacco: Never  Vaping Use   Vaping Use: Former   Quit date: 02/20/2020  Substance and Sexual Activity   Alcohol use: Yes    Alcohol/week: 7.0 standard drinks of alcohol    Types: 7 Standard drinks or equivalent per week    Comment: 1 drink per day   Drug use: Never   Sexual activity: Yes    Birth control/protection: I.U.D.  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  All other systems are negative.  Review of Systems  Constitutional:  Negative for appetite change, chills, fatigue and fever.  Respiratory:  Negative for shortness of breath.   Cardiovascular:  Negative for chest pain.  Gastrointestinal:  Positive for nausea. Negative for abdominal pain and vomiting.  Neurological:  Positive for headaches. Negative for dizziness and weakness.    Vital Signs: LMP 02/15/2022 (Exact Date) Comment: bilateral Tubal ligation 2020    Physical Exam Vitals reviewed.  Constitutional:      General: She is not in acute distress.    Appearance: Normal appearance. She is not ill-appearing.  HENT:     Head: Normocephalic and atraumatic.      Mouth/Throat:     Mouth: Mucous membranes are dry.     Pharynx: Oropharynx is clear.  Eyes:     Extraocular Movements: Extraocular movements intact.     Pupils: Pupils are equal, round, and reactive to light.  Cardiovascular:     Rate and Rhythm: Normal rate and regular rhythm.     Pulses: Normal pulses.     Heart sounds: Normal heart sounds.  Pulmonary:     Effort: Pulmonary effort is normal. No respiratory distress.  Breath sounds: Normal breath sounds.  Abdominal:     General: Bowel sounds are normal. There is no distension.     Palpations: Abdomen is soft.     Tenderness: There is no abdominal tenderness. There is no guarding.  Musculoskeletal:     Right lower leg: No edema.     Left lower leg: No edema.  Skin:    General: Skin is warm and dry.  Neurological:     Mental Status: She is alert and oriented to person, place, and time.  Psychiatric:        Mood and Affect: Mood normal.        Behavior: Behavior normal.        Thought Content: Thought content normal.        Judgment: Judgment normal.     Imaging: No results found.  Labs:  CBC: Recent Labs    11/20/21 1935  WBC 9.9  HGB 12.4  HCT 36.9  PLT 247    COAGS: No results for input(s): "INR", "APTT" in the last 8760 hours.  BMP: Recent Labs    11/20/21 1935 12/15/21 1602  NA 137 137  K 4.0 3.8  CL 106 102  CO2 23 26  GLUCOSE 95 124*  BUN 11 13  CALCIUM 9.5 9.8  CREATININE 0.79 0.78  GFRNONAA >60  --     LIVER FUNCTION TESTS: Recent Labs    11/20/21 1935 12/15/21 1602  BILITOT 0.4 0.2  AST 21 13  ALT 21 16  ALKPHOS 56 56  PROT 7.0 7.3  ALBUMIN 4.2 4.5    TUMOR MARKERS: No results for input(s): "AFPTM", "CEA", "CA199", "CHROMGRNA" in the last 8760 hours.  Assessment and Plan:  41 yo female with RUQ pain and hepatomegaly with normal liver enzymes presents today for random liver biopsy, referred by Dr. Tressia Danas.   Pt sitting upright on stretcher. She is A&O, calm and  pleasant.  She is in no distress.  Pt is NPO per order.  She denies the use of blood thinning medication, but states she took ibuprofen yesterday.   Risks and benefits of random liver biopsy with moderate sedation was discussed with the patient and/or patient's family including, but not limited to bleeding, infection, damage to adjacent structures or low yield requiring additional tests.  All of the questions were answered and there is agreement to proceed.  Consent signed and in chart.   Thank you for this interesting consult.  I greatly enjoyed meeting Niara M Antunes and look forward to participating in their care.  A copy of this report was sent to the requesting provider on this date.  Electronically Signed: Shon Hough, NP 03/01/2022, 11:57 AM   I spent a total of 20 minutes in face to face in clinical consultation, greater than 50% of which was counseling/coordinating care for hepatomegaly with normal liver enzymes.

## 2022-03-01 ENCOUNTER — Encounter (HOSPITAL_COMMUNITY): Payer: Self-pay

## 2022-03-01 ENCOUNTER — Ambulatory Visit (HOSPITAL_COMMUNITY)
Admission: RE | Admit: 2022-03-01 | Discharge: 2022-03-01 | Disposition: A | Discharge status: Home / Self Care | Payer: Federal, State, Local not specified - PPO | Source: Ambulatory Visit | Attending: Gastroenterology | Admitting: Gastroenterology

## 2022-03-01 DIAGNOSIS — R160 Hepatomegaly, not elsewhere classified: Secondary | ICD-10-CM | POA: Insufficient documentation

## 2022-03-01 DIAGNOSIS — R162 Hepatomegaly with splenomegaly, not elsewhere classified: Secondary | ICD-10-CM | POA: Diagnosis not present

## 2022-03-01 DIAGNOSIS — F17210 Nicotine dependence, cigarettes, uncomplicated: Secondary | ICD-10-CM | POA: Insufficient documentation

## 2022-03-01 DIAGNOSIS — Z8719 Personal history of other diseases of the digestive system: Secondary | ICD-10-CM | POA: Insufficient documentation

## 2022-03-01 DIAGNOSIS — K2100 Gastro-esophageal reflux disease with esophagitis, without bleeding: Secondary | ICD-10-CM | POA: Diagnosis not present

## 2022-03-01 LAB — COMPREHENSIVE METABOLIC PANEL
ALT: 17 U/L (ref 0–44)
AST: 21 U/L (ref 15–41)
Albumin: 4.5 g/dL (ref 3.5–5.0)
Alkaline Phosphatase: 47 U/L (ref 38–126)
Anion gap: 9 (ref 5–15)
BUN: 13 mg/dL (ref 6–20)
CO2: 22 mmol/L (ref 22–32)
Calcium: 9.6 mg/dL (ref 8.9–10.3)
Chloride: 109 mmol/L (ref 98–111)
Creatinine, Ser: 0.82 mg/dL (ref 0.44–1.00)
GFR, Estimated: >60 mL/min (ref >60)
Glucose, Bld: 91 mg/dL (ref 70–99)
Potassium: 4.1 mmol/L (ref 3.5–5.1)
Sodium: 140 mmol/L (ref 135–145)
Total Bilirubin: 0.4 mg/dL (ref 0.3–1.2)
Total Protein: 7.5 g/dL (ref 6.5–8.1)

## 2022-03-01 LAB — CBC WITH DIFFERENTIAL/PLATELET
Abs Immature Granulocytes: 0.06 10*3/uL (ref 0.00–0.07)
Basophils Absolute: 0.1 10*3/uL (ref 0.0–0.1)
Basophils Relative: 1 %
Eosinophils Absolute: 0.2 10*3/uL (ref 0.0–0.5)
Eosinophils Relative: 2 %
HCT: 38.2 % (ref 36.0–46.0)
Hemoglobin: 12.4 g/dL (ref 12.0–15.0)
Immature Granulocytes: 1 %
Lymphocytes Relative: 30 %
Lymphs Abs: 2.7 10*3/uL (ref 0.7–4.0)
MCH: 31.2 pg (ref 26.0–34.0)
MCHC: 32.5 g/dL (ref 30.0–36.0)
MCV: 96 fL (ref 80.0–100.0)
Monocytes Absolute: 0.6 10*3/uL (ref 0.1–1.0)
Monocytes Relative: 7 %
Neutro Abs: 5.6 10*3/uL (ref 1.7–7.7)
Neutrophils Relative %: 59 %
Platelets: 272 10*3/uL (ref 150–400)
RBC: 3.98 MIL/uL (ref 3.87–5.11)
RDW: 12.6 % (ref 11.5–15.5)
WBC: 9.3 10*3/uL (ref 4.0–10.5)
nRBC: 0 % (ref 0.0–0.2)

## 2022-03-01 LAB — PROTIME-INR
INR: 1 (ref 0.8–1.2)
Prothrombin Time: 13.2 seconds (ref 11.4–15.2)

## 2022-03-01 MED ORDER — SODIUM CHLORIDE 0.9 % IV SOLN: INTRAVENOUS | Status: DC

## 2022-03-01 MED ORDER — GELATIN ABSORBABLE 12-7 MM EX MISC
CUTANEOUS | Status: AC
  Administered 2022-03-01: 1
  Filled 2022-03-01: qty 1

## 2022-03-01 MED ORDER — FENTANYL CITRATE (PF) 100 MCG/2ML IJ SOLN
INTRAMUSCULAR | Status: AC
  Filled 2022-03-01: qty 4

## 2022-03-01 MED ORDER — MIDAZOLAM HCL 2 MG/2ML IJ SOLN
INTRAMUSCULAR | Status: AC | PRN
  Administered 2022-03-01 (×2): 1 mg via INTRAVENOUS

## 2022-03-01 MED ORDER — MIDAZOLAM HCL 2 MG/2ML IJ SOLN
INTRAMUSCULAR | Status: AC
  Filled 2022-03-01: qty 4

## 2022-03-01 MED ORDER — LIDOCAINE HCL 1 % IJ SOLN
INTRAMUSCULAR | Status: AC
  Administered 2022-03-01: 10 mL
  Filled 2022-03-01: qty 20

## 2022-03-01 MED ORDER — FLUMAZENIL 0.5 MG/5ML IV SOLN
INTRAVENOUS | Status: AC
  Filled 2022-03-01: qty 5

## 2022-03-01 MED ORDER — FENTANYL CITRATE (PF) 100 MCG/2ML IJ SOLN
INTRAMUSCULAR | Status: AC | PRN
  Administered 2022-03-01 (×2): 50 ug via INTRAVENOUS

## 2022-03-01 MED ORDER — FENTANYL CITRATE (PF) 100 MCG/2ML IJ SOLN
INTRAMUSCULAR | Status: AC
  Filled 2022-03-01: qty 2

## 2022-03-01 MED ORDER — NALOXONE HCL 0.4 MG/ML IJ SOLN
INTRAMUSCULAR | Status: AC
  Filled 2022-03-01: qty 1

## 2022-03-01 NOTE — Procedures (Signed)
Interventional Radiology Procedure Note  Procedure: US Guided Biopsy of Liver  Complications: None  Estimated Blood Loss: < 10 mL  Findings: 18 G core biopsy of liver performed under US guidance.  Three core samples obtained and sent to Pathology.  Konni Kesinger T. Naly Schwanz, M.D Pager:  [phone]    

## 2022-03-01 NOTE — Discharge Instructions (Signed)
Please call Interventional Radiology clinic [phone] with any questions or concerns.  You may remove your dressing and shower tomorrow.   Moderate Conscious Sedation, Adult, Care After This sheet gives you information about how to care for yourself after your procedure. Your health care provider may also give you more specific instructions. If you have problems or questions, contact your health careprovider. What can I expect after the procedure? After the procedure, it is common to have: Sleepiness for several hours. Impaired judgment for several hours. Difficulty with balance. Vomiting if you eat too soon. Follow these instructions at home: For the time period you were told by your health care provider: Rest. Do not participate in activities where you could fall or become injured. Do not drive or use machinery. Do not drink alcohol. Do not take sleeping pills or medicines that cause drowsiness. Do not make important decisions or sign legal documents. Do not take care of children on your own. Eating and drinking  Follow the diet recommended by your health care provider. Drink enough fluid to keep your urine pale yellow. If you vomit: Drink water, juice, or soup when you can drink without vomiting. Make sure you have little or no nausea before eating solid foods.  General instructions Take over-the-counter and prescription medicines only as told by your health care provider. Have a responsible adult stay with you for the time you are told. It is important to have someone help care for you until you are awake and alert. Do not smoke. Keep all follow-up visits as told by your health care provider. This is important. Contact a health care provider if: You are still sleepy or having trouble with balance after 24 hours. You feel light-headed. You keep feeling nauseous or you keep vomiting. You develop a rash. You have a fever. You have redness or swelling around the IV  site. Get help right away if: You have trouble breathing. You have new-onset confusion at home. Summary After the procedure, it is common to feel sleepy, have impaired judgment, or feel nauseous if you eat too soon. Rest after you get home. Know the things you should not do after the procedure. Follow the diet recommended by your health care provider and drink enough fluid to keep your urine pale yellow. Get help right away if you have trouble breathing or new-onset confusion at home. This information is not intended to replace advice given to you by your health care provider. Make sure you discuss any questions you have with your healthcare provider. Document Revised: 10/19/2019 Document Reviewed: 05/17/2019 Elsevier Patient Education  2022 [employer].   Liver Biopsy, Care After After a liver biopsy, it is common to have these things in the area where the biopsy was done. You may: Have pain. Feel sore. Have bruising. You may also feel tired for a few days. Follow these instructions at home: Medicines Take over-the-counter and prescription medicines only as told by your doctor. If you were prescribed an antibiotic medicine, take it as told by your doctor. Do not stop taking the antibiotic, even if you start to feel better. Do not take medicines that may thin your blood. These medicines include aspirin and ibuprofen. Take them only if your doctor tells you to. If told, take steps to prevent problems with pooping (constipation). You may need to: Drink enough fluid to keep your pee (urine) pale yellow. Take medicines. You will be told what medicines to take. Eat foods that are high in fiber. These include beans,  whole grains, and fresh fruits and vegetables. Limit foods that are high in fat and sugar. These include fried or sweet foods. Ask your doctor if you should avoid driving or using machines while you are taking your medicine. Caring for your incision Follow instructions from  your doctor about how to take care of your cut from surgery (incisions). Make sure you: Wash your hands with soap and water for at least 20 seconds before and after you change your bandage. If you cannot use soap and water, use hand sanitizer. Change your bandage. Leavestitches or skin glue in place for at least two weeks. Leave tape strips alone unless you are told to take them off. You may trim the edges of the tape strips if they curl up. Check your incision every day for signs of infection. Check for: Redness, swelling, or more pain. Fluid or blood. Warmth. Pus or a bad smell. Do not take baths, swim, or use a hot tub. Ask your doctor about taking showers or sponge baths. Activity Rest at home for 1-2 days, or as told by your doctor. Get up to take short walks every 1 to 2 hours. Ask for help if you feel weak or unsteady. Do not lift anything that is heavier than 10 lb (4.5 kg), or the limit that you are told. Do not play contact sports for 2 weeks after the procedure. Return to your normal activities as told by your doctor. Ask what activities are safe for you. General instructions Do not drink alcohol in the first week after the procedure. Plan to have a responsible adult care for you for the time you are told after you leave the hospital or clinic. This is important. It is up to you to get the results of your procedure. Ask how to get your results when they are ready. Keep all follow-up visits.   Contact a doctor if: You have more bleeding in your incision. Your incision swells, or is red and more painful. You have fluid that comes from your incision. You develop a rash. You have fever or chills. Get help right away if: You have swelling, bloating, or pain in your belly (abdomen). You get dizzy or faint. You vomit or you feel like vomiting. You have trouble breathing or feel short of breath. You have chest pain. You have problems talking or seeing. You have trouble with  your balance or moving your arms or legs. These symptoms may be an emergency. Get help right away. Call your local emergency services (911 in the [place] Do not wait to see if the symptoms will go away. Do not drive yourself to the hospital. Summary After the procedure, it is common to have pain, soreness, bruising, and tiredness. Your doctor will tell you how to take care of yourself at home. Change your bandage, take your medicines, and limit your activities as told by your doctor. Call your doctor if you have symptoms of infection. Get help right away if your belly swells, your cut bleeds a lot, or you have trouble talking or breathing. This information is not intended to replace advice given to you by your health care provider. Make sure you discuss any questions you have with your healthcare provider. Document Revised: 05/05/2020 Document Reviewed: 05/05/2020 Elsevier Patient Education  2022 [employer].

## 2022-03-04 ENCOUNTER — Encounter: Payer: Self-pay | Admitting: Gastroenterology

## 2022-03-15 LAB — SURGICAL PATHOLOGY

## 2022-03-22 ENCOUNTER — Other Ambulatory Visit: Payer: Self-pay

## 2022-03-22 ENCOUNTER — Telehealth: Payer: Self-pay

## 2022-03-22 DIAGNOSIS — R748 Abnormal levels of other serum enzymes: Secondary | ICD-10-CM

## 2022-03-22 DIAGNOSIS — R160 Hepatomegaly, not elsewhere classified: Secondary | ICD-10-CM

## 2022-03-22 NOTE — Telephone Encounter (Signed)
Patient Advocate Encounter   Received notification from [facility] that prior authorization is required for Pantoprazole Sodium '40MG'$  dr tablets. PA submitted and APPROVED on 03/22/2022.  Key [id number] Effective: 02/20/2022 - 03/22/2023  Clista Bernhardt, CPhT Rx Patient Advocate Phone: [phone]

## 2022-03-23 ENCOUNTER — Other Ambulatory Visit: Payer: Self-pay

## 2022-03-23 DIAGNOSIS — R748 Abnormal levels of other serum enzymes: Secondary | ICD-10-CM

## 2022-03-26 ENCOUNTER — Encounter: Payer: Self-pay | Admitting: Physician Assistant

## 2022-03-26 ENCOUNTER — Telehealth: Payer: Self-pay

## 2022-03-26 ENCOUNTER — Other Ambulatory Visit (INDEPENDENT_AMBULATORY_CARE_PROVIDER_SITE_OTHER): Payer: Federal, State, Local not specified - PPO

## 2022-03-26 ENCOUNTER — Ambulatory Visit: Payer: Federal, State, Local not specified - PPO | Admitting: Physician Assistant

## 2022-03-26 VITALS — BP 120/70 | HR 97 | Ht 64.0 in | Wt 209.1 lb

## 2022-03-26 DIAGNOSIS — K219 Gastro-esophageal reflux disease without esophagitis: Secondary | ICD-10-CM

## 2022-03-26 DIAGNOSIS — K824 Cholesterolosis of gallbladder: Secondary | ICD-10-CM

## 2022-03-26 DIAGNOSIS — R110 Nausea: Secondary | ICD-10-CM

## 2022-03-26 DIAGNOSIS — R748 Abnormal levels of other serum enzymes: Secondary | ICD-10-CM

## 2022-03-26 DIAGNOSIS — R1011 Right upper quadrant pain: Secondary | ICD-10-CM | POA: Diagnosis not present

## 2022-03-26 DIAGNOSIS — R160 Hepatomegaly, not elsewhere classified: Secondary | ICD-10-CM

## 2022-03-26 LAB — LIPASE: Lipase: 99 U/L — ABNORMAL HIGH (ref 11.0–59.0)

## 2022-03-26 MED ORDER — PANTOPRAZOLE SODIUM 40 MG PO TBEC: 40.0000 mg | DELAYED_RELEASE_TABLET | Freq: Two times a day (BID) | ORAL | 3 refills | Status: DC

## 2022-03-26 MED ORDER — ONDANSETRON HCL 8 MG PO TABS: 8.0000 mg | ORAL_TABLET | Freq: Three times a day (TID) | ORAL | 0 refills | Status: AC | PRN

## 2022-03-26 NOTE — Telephone Encounter (Signed)
Called and spoke with patient regarding results and recommendations. Pt is aware that we have ordered at CT scan and knows to expect a call from radiology scheduling to set up her appt. Pt verbalized understanding and had no concerns at the end of the call.  CT order in epic. Secure staff message sent to radiology scheduling to contact patient to set up appt.

## 2022-03-26 NOTE — Telephone Encounter (Signed)
-----   Message from Levin Erp, [place] sent at 03/26/2022  2:12 PM EDT ----- Regarding: Elevated lipase Lipase remains elevated.  Please order CT with pancreatic protocol per recommendations from Dr. Tarri Glenn.  Thanks, JL L ----- Message ----- From: Thornton Park, MD Sent: 03/26/2022  12:14 PM EDT To: Levin Erp, PA     ----- Message ----- From: Levin Erp, [place] Sent: 03/26/2022  10:16 AM EDT To: Thornton Park, MD

## 2022-03-26 NOTE — Patient Instructions (Signed)
If you are age 42 or older, your body mass index should be between 23-30. Your Body mass index is 35.9 kg/m. If this is out of the aforementioned range listed, please consider follow up with your Primary Care Provider.  If you are age 26 or younger, your body mass index should be between 19-25. Your Body mass index is 35.9 kg/m. If this is out of the aformentioned range listed, please consider follow up with your Primary Care Provider.   ________________________________________________________  The [facility] providers would like to encourage you to use [facility] to communicate with providers for non-urgent requests or questions.  Due to long hold times on the telephone, sending your provider a message by [facility] may be a faster and more efficient way to get a response.  Please allow 48 business hours for a response.  Please remember that this is for non-urgent requests.  _______________________________________________________   We have sent the following medications to your pharmacy for you to pick up at your convenience: Zofran & Pantoprazole  It was a pleasure to see you today!  Thank you for trusting me with your gastrointestinal care!

## 2022-03-26 NOTE — Progress Notes (Signed)
Reviewed and agree with management plans. As lipase remains elevated, proceed with CT pancreatic protocol. May ultimately need EUS.   Namya Voges L. Tarri Glenn, MD, MPH

## 2022-03-26 NOTE — Progress Notes (Signed)
Chief Complaint: Right upper quadrant pain  HPI:    Mrs.Buss is a 41 year old female, known to Dr. Tarri Glenn, with a past medical history as listed below including GERD, IBS, interstitial cystitis and multiple others, who presents to clinic today as a referral from Theresia Lo, NP for a complaint of right upper quadrant pain.      02/11/2022 patient seen in clinic by Dr. Tarri Glenn.  At that time discussed hepatomegaly and mildly enlarged spleen.  Patient had recent ultrasound and MRI.  Recommended a liver biopsy and serologic evaluation for common causes of hepatomegaly with normal liver enzymes.  Also had a persistently elevated lipase.  That time recommended additional pancreatic imaging.  Discussed some right upper quadrant pain which had since resolved.  Described a history of esophagitis, gastritis and duodenitis.  Recent HIDA scan had shown bile acid reflux but she did not tolerate a trial of Carafate.  That time continued on Pantoprazole 40 mg every morning, recommended consideration of EUS versus pancreatic protocol CT if lipase did not normalize.    01/08/2022 lipase 99.    03/01/2022 liver biopsy was normal.  At that time Dr. Tarri Glenn recommended recheck of serum lipase level.    Today, the patient tells me that when she called she was having a little bit of right upper quadrant pain that felt like stabbing that seem to "fade in and out", but this only lasted for about 24 to 48 hours and then went away and she has been fine over the past couple of weeks.  Tells me in general she feels fairly good.  She has not had time to come in and get repeat lipase drawn.  Does ask about refills for her Zofran as she does get nauseous occasionally and her Pantoprazole.    Denies fever, chills, weight loss or change in bowel habits.  Past Medical History:  Diagnosis Date   Abnormal uterine bleeding (AUB)    Allergic rhinitis    Anal fissure    Anxiety    Chronic headaches    Colon polyps     Environmental allergies    GERD (gastroesophageal reflux disease)    History of colon polyps    02/ 2020   History of gastritis    02/ 2020 and esophagitis   History of migraine    History of palpitations    pt evalutated by cardiology, dr Angelena Form, note 09-20-2012 in epic;  event monitor 08-30-2012 showed NSR w/ rare PAC and echo 08-30-2012 ef 60-65% and trivial MR;   non-cardiac   Hypertension    IBS (irritable bowel syndrome)    IC (interstitial cystitis)    per pt no treatment or seen urologist in years   Migraine    Wears contact lenses     Past Surgical History:  Procedure Laterality Date   CESAREAN SECTION  02-27-2007; 10-07- 2010   '@[facility]'$    COLONOSCOPY WITH ESOPHAGOGASTRODUODENOSCOPY (EGD)  08-15-2018   dr Tarri Glenn   LAPAROSCOPIC TUBAL LIGATION  08/30/2019   Procedure: LAPAROSCOPIC TUBAL LIGATION;  Surgeon: Arvella Nigh, MD;  Location: [facility];  Service: Gynecology;;   LAPAROSCOPY N/A 08/30/2019   Procedure: LAPAROSCOPY DIAGNOSTIC;  Surgeon: Arvella Nigh, MD;  Location: [facility];  Service: Gynecology;  Laterality: N/A;   TONSILLECTOMY  2000    Current Outpatient Medications  Medication Sig Dispense Refill   cetirizine (ZYRTEC) 10 MG tablet Take 10 mg by mouth at bedtime.      dicyclomine (BENTYL) 20 MG tablet  Take 1 tablet (20 mg total) by mouth 2 (two) times daily as needed for spasms. Office visit for further refills 60 tablet 1   ibuprofen (ADVIL) 200 MG tablet Take 200 mg by mouth every 6 (six) hours as needed.     ibuprofen (ADVIL) 800 MG tablet Take 1 tablet (800 mg total) by mouth every 8 (eight) hours as needed. 30 tablet 0   ondansetron (ZOFRAN) 8 MG tablet Take 1 tablet (8 mg total) by mouth every 8 (eight) hours as needed for nausea or vomiting. 90 tablet 0   pantoprazole (PROTONIX) 40 MG tablet Take 1 tablet (40 mg total) by mouth daily. 90 tablet 3   venlafaxine (EFFEXOR) 75 MG tablet Take 75 mg by mouth at bedtime.       sucralfate (CARAFATE) 1 GM/10ML suspension Take 10 mLs (1 g total) by mouth 4 (four) times daily for 14 days. 560 mL 0   No current facility-administered medications for this visit.    Allergies as of 03/26/2022 - Review Complete 03/26/2022  Allergen Reaction Noted   Cefdinir Hives 02/07/2014   Penicillins Hives 01/13/2018    Family History  Problem Relation Age of Onset   Hypertension Mother    Hyperlipidemia Mother    Mental illness Mother        Borderline personality d/o, bipolar, manic depressive   Celiac disease Mother    Thyroid disease Mother    Colon polyps Mother    Diabetes Mother    Irritable bowel syndrome Mother    Cancer - Other Father        Throat   Diabetes Paternal Grandfather    CAD Neg Hx    Neuromuscular disorder Neg Hx    Stomach cancer Neg Hx    Esophageal cancer Neg Hx     Social History   Socioeconomic History   Marital status: Married    Spouse name: Not on file   Number of children: 2   Years of education: 10   Highest education level: Not on file  Occupational History   Occupation: Insurance risk surveyor   Occupation: Ecologist  Tobacco Use   Smoking status: Every Day    Packs/day: 0.50    Years: 25.00    Total pack years: 12.50    Types: Cigarettes   Smokeless tobacco: Never  Vaping Use   Vaping Use: Former   Quit date: 02/20/2020  Substance and Sexual Activity   Alcohol use: Yes    Alcohol/week: 7.0 standard drinks of alcohol    Types: 7 Standard drinks or equivalent per week    Comment: 1 drink per day   Drug use: Never   Sexual activity: Yes    Birth control/protection: I.U.D.  Other Topics Concern   Not on file  Social History Narrative   Work or School: [employer]      Home Situation: lives with husband and two children      Spiritual Beliefs: none      Lifestyle: no regular exercise; diet is terrible      Right-handed      Caffeine: coffee, tea and sodas throughout the day       Social Determinants of Health   Financial Resource Strain: Not on file  Food Insecurity: Not on file  Transportation Needs: Not on file  Physical Activity: Not on file  Stress: Not on file  Social Connections: Not on file  Intimate Partner Violence: Not on file    Review of  Systems:    Constitutional: No weight loss, fever or chills Cardiovascular: No chest pain Respiratory: No SOB  Gastrointestinal: See HPI and otherwise negative   Physical Exam:  Vital signs: Ht '5\' 4"'$  (1.626 m)   Wt 209 lb 2 oz (94.9 kg)   LMP 02/15/2022 (Exact Date) Comment: bilateral Tubal ligation 2020  BMI 35.90 kg/m   Constitutional:   Pleasant overweight Caucasian female appears to be in NAD, Well developed, Well nourished, alert and cooperative Respiratory: Respirations even and unlabored. Lungs clear to auscultation bilaterally.   No wheezes, crackles, or rhonchi.  Cardiovascular: Normal S1, S2. No MRG. Regular rate and rhythm. No peripheral edema, cyanosis or pallor.  Gastrointestinal:  Soft, nondistended, nontender. No rebound or guarding. Normal bowel sounds. No appreciable masses or hepatomegaly. Rectal:  Not performed.  Psychiatric: Oriented to person, place and time. Demonstrates good judgement and reason without abnormal affect or behaviors.  RELEVANT LABS AND IMAGING: CBC    Component Value Date/Time   WBC 9.3 03/01/2022 1145   RBC 3.98 03/01/2022 1145   HGB 12.4 03/01/2022 1145   HGB 13.1 11/08/2016 1624   HCT 38.2 03/01/2022 1145   HCT 39.7 11/08/2016 1624   PLT 272 03/01/2022 1145   PLT 272 11/08/2016 1624   MCV 96.0 03/01/2022 1145   MCV 92 11/08/2016 1624   MCH 31.2 03/01/2022 1145   MCHC 32.5 03/01/2022 1145   RDW 12.6 03/01/2022 1145   RDW 13.1 11/08/2016 1624   LYMPHSABS 2.7 03/01/2022 1145   MONOABS 0.6 03/01/2022 1145   EOSABS 0.2 03/01/2022 1145   BASOSABS 0.1 03/01/2022 1145    CMP     Component Value Date/Time   NA 140 03/01/2022 1145   NA 138 11/08/2016  1624   K 4.1 03/01/2022 1145   CL 109 03/01/2022 1145   CO2 22 03/01/2022 1145   GLUCOSE 91 03/01/2022 1145   BUN 13 03/01/2022 1145   BUN 12 11/08/2016 1624   CREATININE 0.82 03/01/2022 1145   CALCIUM 9.6 03/01/2022 1145   PROT 7.5 03/01/2022 1145   PROT 7.4 11/08/2016 1624   ALBUMIN 4.5 03/01/2022 1145   ALBUMIN 4.7 11/08/2016 1624   AST 21 03/01/2022 1145   ALT 17 03/01/2022 1145   ALKPHOS 47 03/01/2022 1145   BILITOT 0.4 03/01/2022 1145   BILITOT 0.4 11/08/2016 1624   GFRNONAA >60 03/01/2022 1145   GFRAA 105 11/08/2016 1624    Assessment: 1.  Chronic intermittent right upper quadrant pain: Another episode a couple of weeks ago, better now, previous HIDA scan negative, liver biopsy normal, does have bile reflux which could be the cause but cannot tolerate liquid Carafate 2.  Elevated lipase: Uncertain etiology 3.  Hepatomegaly: Recent normal liver biopsy, normal LFTs; uncertain etiology  Plan: 1.  Reviewed recent liver biopsies which were normal.  Answered patient's questions. 2.  Recommend the patient go to the lab and have her lipase drawn as requested by Dr. Tarri Glenn. 3.  Pending above could consider EUS/CT with pancreatic protocol as recommended by Dr. Tarri Glenn 4.  Refilled Pantoprazole 40 mg twice daily, 30-60 minutes before breakfast and dinner.  #60 with 5 refills. 5.  Refilled Zofran 8 mg every 4-6 hours as needed for nausea #30 with 1 refill. 6.  Could trial Carafate tablets in the future if continues with right upper quadrant pain for bile reflux. 7.  Patient follow in clinic per recommendations after labs above.  Ellouise Newer, PA-C [facility] 03/26/2022, 9:32 AM  Cc: [facility],  [place], NP

## 2022-03-29 NOTE — Telephone Encounter (Signed)
Pts appt sched on 04/06/22 @ 8am  S/w pt needs pt to arrive @ 730am, NPO 4hrs prior, pt was made aware to p/u oral prep before sched appt date.Marland Kitchen    [place]

## 2022-03-30 DIAGNOSIS — Z01419 Encounter for gynecological examination (general) (routine) without abnormal findings: Secondary | ICD-10-CM | POA: Diagnosis not present

## 2022-03-31 ENCOUNTER — Other Ambulatory Visit (HOSPITAL_COMMUNITY): Payer: Self-pay

## 2022-04-06 ENCOUNTER — Ambulatory Visit (HOSPITAL_COMMUNITY): Payer: Federal, State, Local not specified - PPO

## 2022-04-15 DIAGNOSIS — Z131 Encounter for screening for diabetes mellitus: Secondary | ICD-10-CM | POA: Diagnosis not present

## 2022-04-15 DIAGNOSIS — E559 Vitamin D deficiency, unspecified: Secondary | ICD-10-CM | POA: Diagnosis not present

## 2022-04-15 DIAGNOSIS — Z7689 Persons encountering health services in other specified circumstances: Secondary | ICD-10-CM | POA: Diagnosis not present

## 2022-04-15 DIAGNOSIS — Z1231 Encounter for screening mammogram for malignant neoplasm of breast: Secondary | ICD-10-CM | POA: Diagnosis not present

## 2022-04-15 DIAGNOSIS — N920 Excessive and frequent menstruation with regular cycle: Secondary | ICD-10-CM | POA: Diagnosis not present

## 2022-04-15 DIAGNOSIS — E785 Hyperlipidemia, unspecified: Secondary | ICD-10-CM | POA: Diagnosis not present

## 2022-04-15 DIAGNOSIS — Z13228 Encounter for screening for other metabolic disorders: Secondary | ICD-10-CM | POA: Diagnosis not present

## 2022-05-13 DIAGNOSIS — N920 Excessive and frequent menstruation with regular cycle: Secondary | ICD-10-CM | POA: Diagnosis not present

## 2022-10-06 DIAGNOSIS — Z13220 Encounter for screening for lipoid disorders: Secondary | ICD-10-CM | POA: Diagnosis not present

## 2023-04-18 ENCOUNTER — Encounter (HOSPITAL_BASED_OUTPATIENT_CLINIC_OR_DEPARTMENT_OTHER): Payer: Self-pay | Admitting: Internal Medicine

## 2023-04-18 ENCOUNTER — Ambulatory Visit (HOSPITAL_BASED_OUTPATIENT_CLINIC_OR_DEPARTMENT_OTHER): Payer: Federal, State, Local not specified - PPO | Admitting: Internal Medicine

## 2023-04-18 VITALS — BP 118/74 | HR 94 | Ht 64.0 in | Wt 206.0 lb

## 2023-04-18 DIAGNOSIS — E785 Hyperlipidemia, unspecified: Secondary | ICD-10-CM

## 2023-04-18 NOTE — Progress Notes (Signed)
LIPID CLINIC CONSULT NOTE  Chief Complaint:  Manage dyslipidemia  Primary Care Physician: Kara Dies, NP  Primary Cardiologist:  None  HPI:  Diana James is a 42 y.o. female who is being seen today for the evaluation of dyslipidemia at the request of Richardean Chimera, MD. this is a pleasant 42 year old female kindly referred for evaluation management of dyslipidemia.  She has been followed by her GYN who had referred her due to high cholesterol.  She had recent lipids in April 2024 which showed total cholesterol 180, triglycerides 226, HDL 41 and LDL 100.  This is after what appears to be being on rosuvastatin 5 mg daily which was started by him.  She reports tolerating the rosuvastatin well without any significant side effects.  Remain on that medication now for the past 6 months.  Her lipids have not been reassessed.  She was referred for further evaluation.  She reports no early onset heart disease in the family.  She has had some issues with IBS as well as splenomegaly and hepatomegaly and had a liver biopsy which was reportedly normal.  She has been seen remotely for palpitations and atypical chest pain by Dr. Clifton James and the workup was largely reassuring.  PMHx:  Past Medical History:  Diagnosis Date   Abnormal uterine bleeding (AUB)    Allergic rhinitis    Anal fissure    Anxiety    Chronic headaches    Colon polyps    Environmental allergies    GERD (gastroesophageal reflux disease)    History of colon polyps    02/ 2020   History of gastritis    02/ 2020 and esophagitis   History of migraine    History of palpitations    pt evalutated by cardiology, dr Clifton James, note 09-20-2012 in epic;  event monitor 08-30-2012 showed NSR w/ rare PAC and echo 08-30-2012 ef 60-65% and trivial MR;   non-cardiac   Hypertension    IBS (irritable bowel syndrome)    IC (interstitial cystitis)    per pt no treatment or seen urologist in years   Migraine    Wears contact lenses      Past Surgical History:  Procedure Laterality Date   CESAREAN SECTION  02-27-2007; 10-07- 2010   @[facility]    COLONOSCOPY WITH ESOPHAGOGASTRODUODENOSCOPY (EGD)  08-15-2018   dr Orvan Falconer   LAPAROSCOPIC TUBAL LIGATION  08/30/2019   Procedure: LAPAROSCOPIC TUBAL LIGATION;  Surgeon: Richardean Chimera, MD;  Location: [facility];  Service: Gynecology;;   LAPAROSCOPY N/A 08/30/2019   Procedure: LAPAROSCOPY DIAGNOSTIC;  Surgeon: Richardean Chimera, MD;  Location: [facility] [place];  Service: Gynecology;  Laterality: N/A;   TONSILLECTOMY  2000    FAMHx:  Family History  Problem Relation Age of Onset   Hypertension Mother    Hyperlipidemia Mother    Mental illness Mother        Borderline personality d/o, bipolar, manic depressive   Celiac disease Mother    Thyroid disease Mother    Colon polyps Mother    Diabetes Mother    Irritable bowel syndrome Mother    Cancer - Other Father        Throat   Diabetes Paternal Grandfather    CAD Neg Hx    Neuromuscular disorder Neg Hx    Stomach cancer Neg Hx    Esophageal cancer Neg Hx     SOCHx:   reports that she has been smoking cigarettes. She has a 12.5 pack-year smoking history.  She has never used smokeless tobacco. She reports current alcohol use of about 7.0 standard drinks of alcohol per week. She reports that she does not use drugs.  ALLERGIES:  Allergies  Allergen Reactions   Cefdinir Hives   Penicillins Hives    ROS: Pertinent items noted in HPI and remainder of comprehensive ROS otherwise negative.  HOME MEDS: Current Outpatient Medications on File Prior to Visit  Medication Sig Dispense Refill   cetirizine (ZYRTEC) 10 MG tablet Take 10 mg by mouth at bedtime.      dicyclomine (BENTYL) 20 MG tablet Take 1 tablet (20 mg total) by mouth 2 (two) times daily as needed for spasms. Office visit for further refills 60 tablet 1   ibuprofen (ADVIL) 200 MG tablet Take 200 mg by mouth every 6 (six) hours as needed.      ibuprofen (ADVIL) 800 MG tablet Take 1 tablet (800 mg total) by mouth every 8 (eight) hours as needed. 30 tablet 0   ondansetron (ZOFRAN) 8 MG tablet Take 1 tablet (8 mg total) by mouth every 8 (eight) hours as needed for nausea or vomiting. 90 tablet 0   pantoprazole (PROTONIX) 40 MG tablet Take 1 tablet (40 mg total) by mouth 2 (two) times daily. 60 tablet 3   venlafaxine (EFFEXOR) 75 MG tablet Take 75 mg by mouth at bedtime.      No current facility-administered medications on file prior to visit.    LABS/IMAGING: No results found for this or any previous visit (from the past 48 hour(s)). No results found.  LIPID PANEL:    Component Value Date/Time   CHOL 158 04/26/2017 0734   TRIG 184.0 (H) 04/26/2017 0734   HDL 32.90 (L) 04/26/2017 0734   CHOLHDL 5 04/26/2017 0734   VLDL 36.8 04/26/2017 0734   LDLCALC 88 04/26/2017 0734   LDLDIRECT 113.0 02/14/2015 0942    WEIGHTS: Wt Readings from Last 3 Encounters:  04/18/23 206 lb (93.4 kg)  03/26/22 209 lb 2 oz (94.9 kg)  03/01/22 214 lb 9.6 oz (97.3 kg)    VITALS: BP 118/74 (BP Location: Left Arm, Patient Position: Sitting, Cuff Size: Large)   Pulse 94   Ht 5\' 4"  (1.626 m)   Wt 206 lb (93.4 kg)   BMI 35.36 kg/m   EXAM: Deferred  EKG: Deferred  ASSESSMENT: Mixed dyslipidemia, goal LDL less than 100  PLAN: 1.   Ms. Person has a mixed dyslipidemia with a target LDL less than 100.  Labs as of April showed an LDL of 100 with triglycerides that were still elevated.  She had only been on Crestor for about a month at that time.  I suspect her numbers may be even further improved.  I like to recheck fasting lipid NMR and LP(a).  Will contact her with those results.  If she is at or near target, would recommend continuing that therapy as she is tolerating it well and she can follow-up with me afterwards as needed.  Thanks as always for the kind referral.  Chrystie Nose, MD, [facility]ros Loll  [facility]  Medical  Director of the Advanced Lipid Disorders &  Cardiovascular Risk Reduction Clinic Diplomate of the American Board of Clinical Lipidology Attending Cardiologist  Direct Dial: [phone]  Fax: [phone]  Website:  [email or web address] Nicely Khalie Wince 04/18/2023, 12:02 PM

## 2023-04-18 NOTE — Patient Instructions (Signed)
Medication Instructions:  NO CHANGES  *If you need a refill on your cardiac medications before your next appointment, please call your pharmacy*   Lab Work: FASTING NMR lipoprofile and LPa in a week or two -- at any [facility]   If you have labs (blood work) drawn today and your tests are completely normal, you will receive your results only by: MyChart Message (if you have MyChart) OR A paper copy in the mail If you have any lab test that is abnormal or we need to change your treatment, we will call you to review the results.    Follow-Up: At [facility], you and your health needs are our priority.  As part of our continuing mission to provide you with exceptional heart care, we have created designated Provider Care Teams.  These Care Teams include your primary Cardiologist (physician) and Advanced Practice Providers (APPs -  Physician Assistants and Nurse Practitioners) who all work together to provide you with the care you need, when you need it.  We recommend signing up for the patient portal called "MyChart".  Sign up information is provided on this After Visit Summary.  MyChart is used to connect with patients for Virtual Visits (Telemedicine).  Patients are able to view lab/test results, encounter notes, upcoming appointments, etc.  Non-urgent messages can be sent to your provider as well.   To learn more about what you can do with MyChart, go to [email or web address].    Your next appointment:   AS NEEDED

## 2023-06-17 ENCOUNTER — Other Ambulatory Visit: Payer: Self-pay | Admitting: *Deleted

## 2023-06-17 MED ORDER — PANTOPRAZOLE SODIUM 40 MG PO TBEC: 40.0000 mg | DELAYED_RELEASE_TABLET | Freq: Two times a day (BID) | ORAL | 3 refills | Status: DC

## 2023-09-27 DIAGNOSIS — Z1151 Encounter for screening for human papillomavirus (HPV): Secondary | ICD-10-CM | POA: Diagnosis not present

## 2023-09-27 DIAGNOSIS — Z1231 Encounter for screening mammogram for malignant neoplasm of breast: Secondary | ICD-10-CM | POA: Diagnosis not present

## 2023-09-27 DIAGNOSIS — Z124 Encounter for screening for malignant neoplasm of cervix: Secondary | ICD-10-CM | POA: Diagnosis not present

## 2023-09-27 DIAGNOSIS — Z01419 Encounter for gynecological examination (general) (routine) without abnormal findings: Secondary | ICD-10-CM | POA: Diagnosis not present

## 2023-09-28 ENCOUNTER — Other Ambulatory Visit (HOSPITAL_COMMUNITY): Payer: Self-pay | Admitting: Obstetrics and Gynecology

## 2023-09-28 DIAGNOSIS — Z8249 Family history of ischemic heart disease and other diseases of the circulatory system: Secondary | ICD-10-CM

## 2023-10-03 DIAGNOSIS — Z1329 Encounter for screening for other suspected endocrine disorder: Secondary | ICD-10-CM | POA: Diagnosis not present

## 2023-10-03 DIAGNOSIS — Z13228 Encounter for screening for other metabolic disorders: Secondary | ICD-10-CM | POA: Diagnosis not present

## 2023-10-03 DIAGNOSIS — Z1321 Encounter for screening for nutritional disorder: Secondary | ICD-10-CM | POA: Diagnosis not present

## 2023-10-03 DIAGNOSIS — Z13220 Encounter for screening for lipoid disorders: Secondary | ICD-10-CM | POA: Diagnosis not present

## 2023-10-03 DIAGNOSIS — Z131 Encounter for screening for diabetes mellitus: Secondary | ICD-10-CM | POA: Diagnosis not present

## 2023-10-10 ENCOUNTER — Ambulatory Visit (HOSPITAL_COMMUNITY)
Admission: RE | Admit: 2023-10-10 | Discharge: 2023-10-10 | Disposition: A | Discharge status: Home / Self Care | Payer: Self-pay | Source: Ambulatory Visit | Attending: Obstetrics and Gynecology | Admitting: Obstetrics and Gynecology

## 2023-10-10 ENCOUNTER — Encounter (HOSPITAL_COMMUNITY): Payer: Self-pay

## 2023-10-10 DIAGNOSIS — Z8249 Family history of ischemic heart disease and other diseases of the circulatory system: Secondary | ICD-10-CM | POA: Insufficient documentation

## 2023-11-04 DIAGNOSIS — R3915 Urgency of urination: Secondary | ICD-10-CM | POA: Diagnosis not present

## 2023-11-04 DIAGNOSIS — N3943 Post-void dribbling: Secondary | ICD-10-CM | POA: Diagnosis not present

## 2023-11-04 DIAGNOSIS — R3982 Chronic bladder pain: Secondary | ICD-10-CM | POA: Diagnosis not present

## 2023-11-04 DIAGNOSIS — R8271 Bacteriuria: Secondary | ICD-10-CM | POA: Diagnosis not present

## 2023-11-04 DIAGNOSIS — R3911 Hesitancy of micturition: Secondary | ICD-10-CM | POA: Diagnosis not present

## 2024-01-10 DIAGNOSIS — E559 Vitamin D deficiency, unspecified: Secondary | ICD-10-CM | POA: Diagnosis not present

## 2024-01-19 IMAGING — US US ABDOMEN LIMITED
1 series · 14 of 25 positions shown · non-contrast
Comparison: Ultrasound dated 01/25/2018.

CLINICAL DATA: Right upper quadrant abdominal pain.

EXAM:
ULTRASOUND ABDOMEN LIMITED RIGHT UPPER QUADRANT

[Series 1: us abdomen limited ruq (liver/gb) · 14 of 100 slices shown]
[im 1/100]
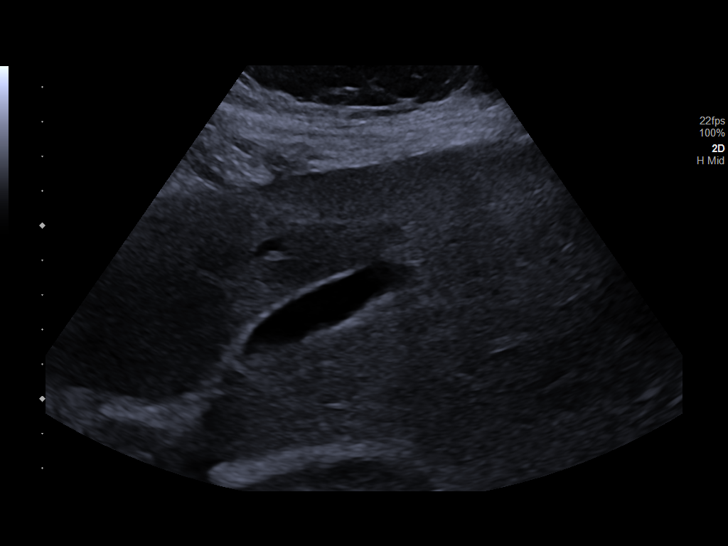
[im 9/100]
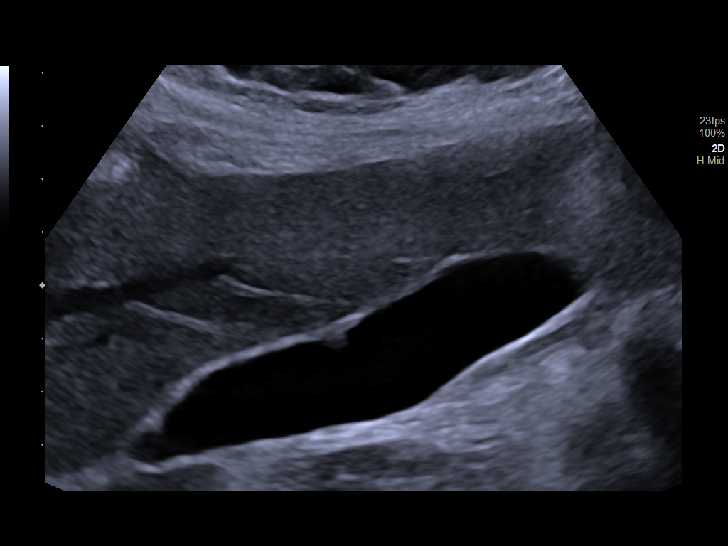
[im 17/100]
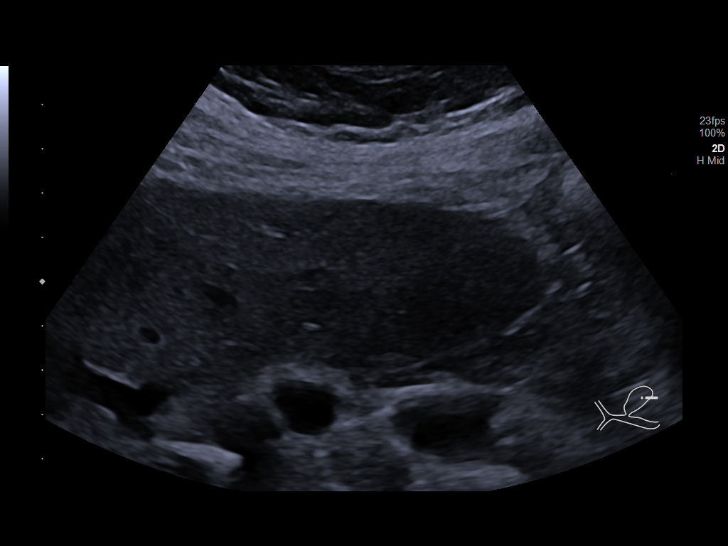
[im 25/100]
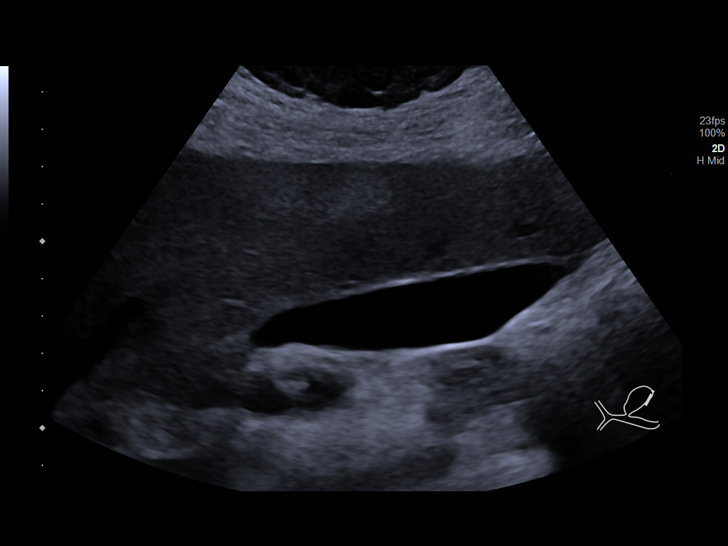
[im 34/100]
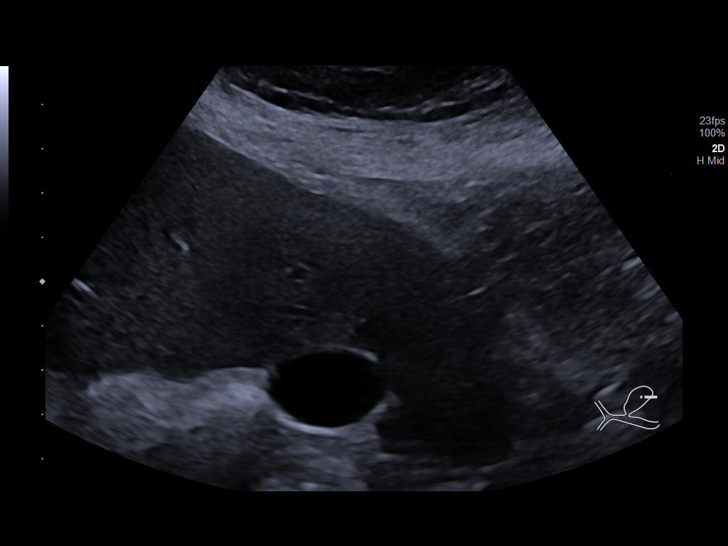
[im 38/100]
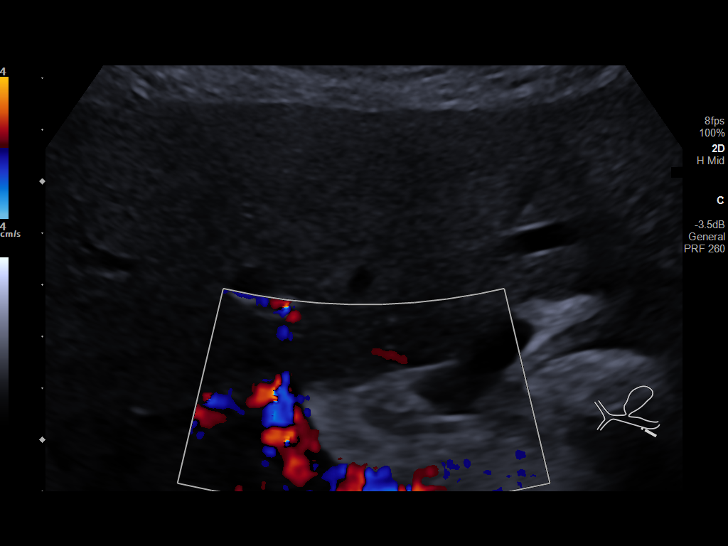
[im 46/100]
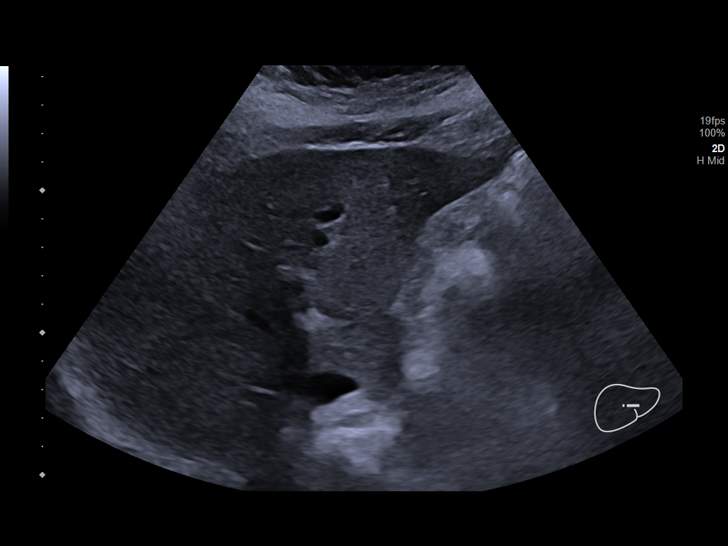
[im 54/100]
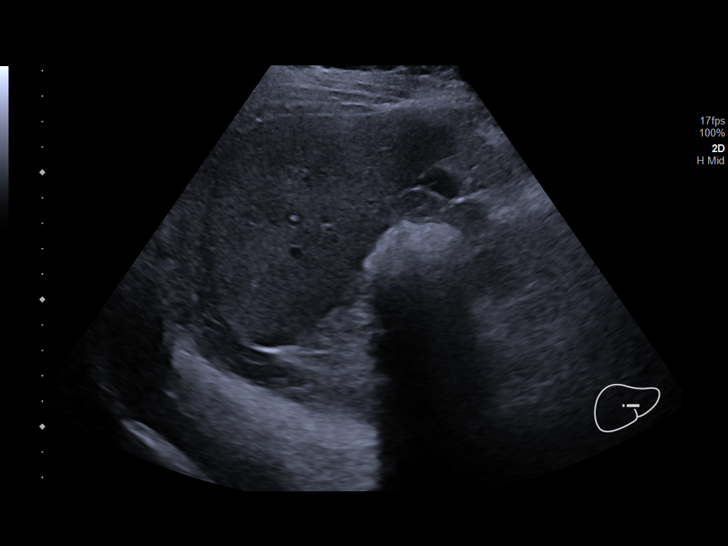
[im 62/100]
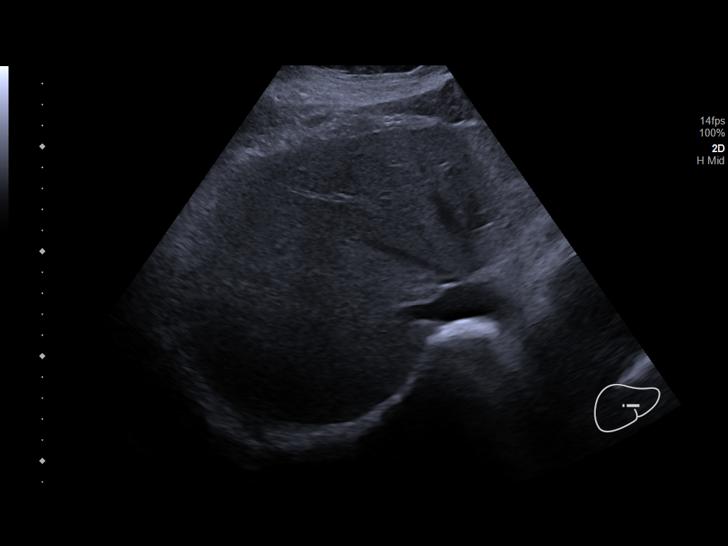
[im 67/100]
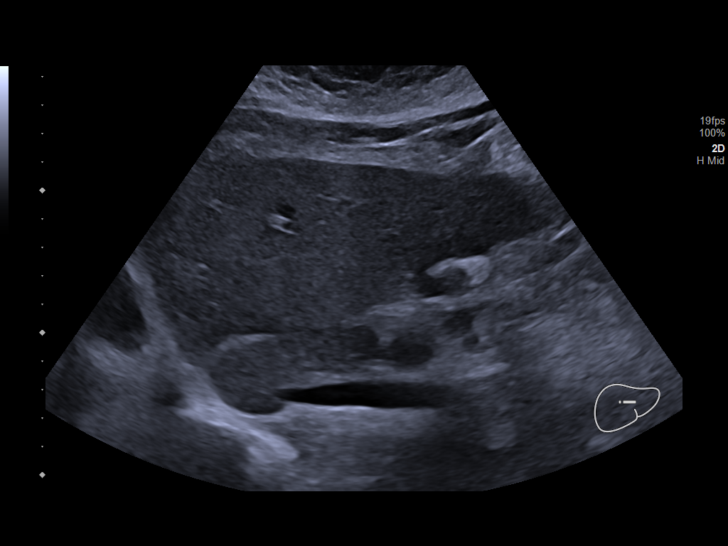
[im 75/100]
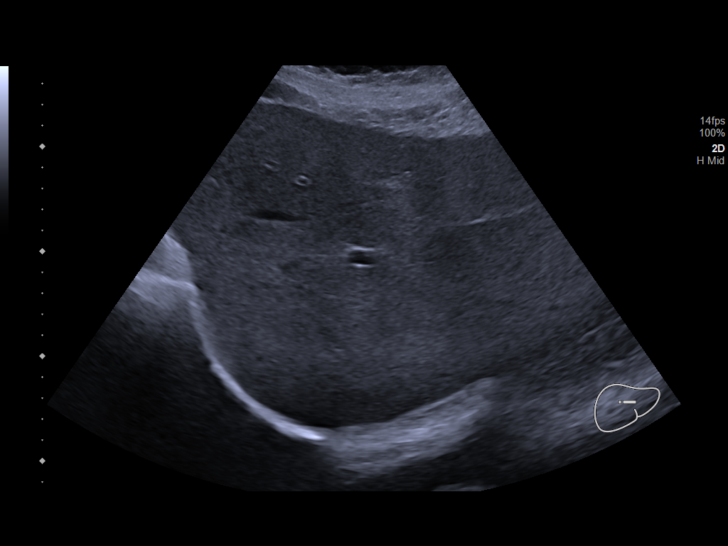
[im 83/100]
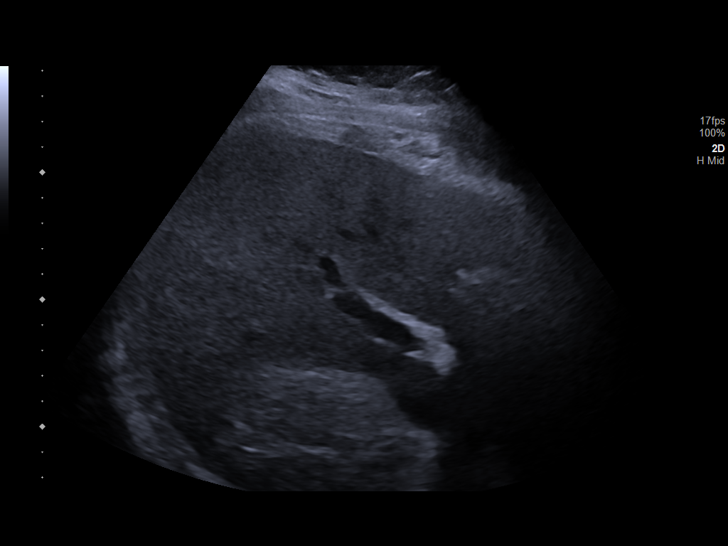
[im 91/100]
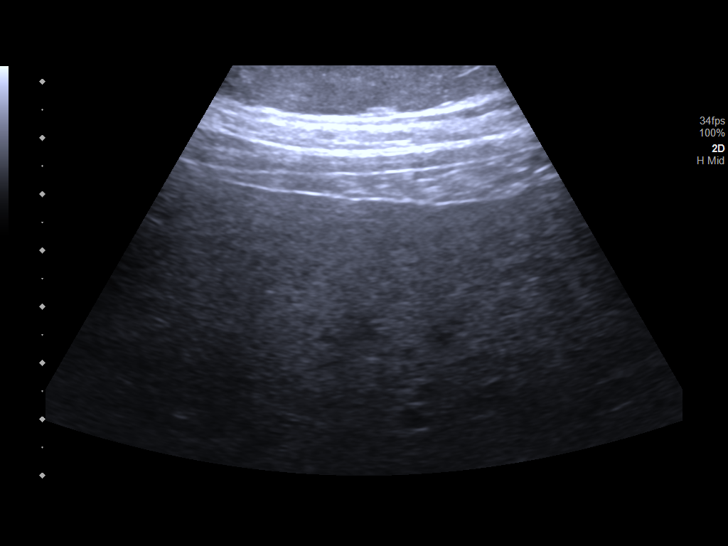
[im 100/100]
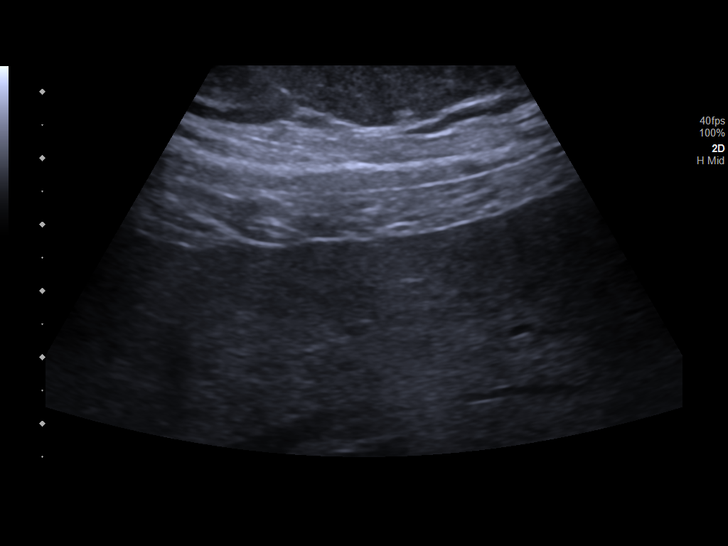

[14 of 25 positions shown; findings below may reference images not displayed]

FINDINGS: Gallbladder:

No gallstones or wall thickening visualized. No sonographic Murphy
sign noted by sonographer. There is a 5 mm gallbladder polyp.

Common bile duct:

Diameter: 3 mm

Liver:

There is mild diffuse increased liver echogenicity most commonly
seen in the setting of fatty infiltration. Superimposed inflammation
or fibrosis is not excluded. Clinical correlation is recommended.
The liver is enlarged. Portal vein is patent on color Doppler
imaging with normal direction of blood flow towards the liver.

Other: None.
IMPRESSION: 1. Enlarged fatty liver. Clinical correlation is recommended to
evaluate for steatohepatitis.
2. A 5 mm gallbladder polyp similar to prior ultrasound.

## 2024-02-22 ENCOUNTER — Ambulatory Visit (HOSPITAL_BASED_OUTPATIENT_CLINIC_OR_DEPARTMENT_OTHER): Admitting: Nurse Practitioner

## 2024-02-22 ENCOUNTER — Encounter (HOSPITAL_BASED_OUTPATIENT_CLINIC_OR_DEPARTMENT_OTHER): Payer: Self-pay | Admitting: Nurse Practitioner

## 2024-02-22 ENCOUNTER — Encounter: Payer: Self-pay | Admitting: Nurse Practitioner

## 2024-02-22 VITALS — BP 128/88 | HR 97 | Ht 64.0 in | Wt 209.0 lb

## 2024-02-22 DIAGNOSIS — Z79899 Other long term (current) drug therapy: Secondary | ICD-10-CM

## 2024-02-22 DIAGNOSIS — R9431 Abnormal electrocardiogram [ECG] [EKG]: Secondary | ICD-10-CM

## 2024-02-22 DIAGNOSIS — F17200 Nicotine dependence, unspecified, uncomplicated: Secondary | ICD-10-CM | POA: Diagnosis not present

## 2024-02-22 DIAGNOSIS — E785 Hyperlipidemia, unspecified: Secondary | ICD-10-CM

## 2024-02-22 DIAGNOSIS — I2510 Atherosclerotic heart disease of native coronary artery without angina pectoris: Secondary | ICD-10-CM | POA: Diagnosis not present

## 2024-02-22 DIAGNOSIS — Z7189 Other specified counseling: Secondary | ICD-10-CM

## 2024-02-22 DIAGNOSIS — R030 Elevated blood-pressure reading, without diagnosis of hypertension: Secondary | ICD-10-CM

## 2024-02-22 NOTE — Progress Notes (Signed)
 Cardiology Office Note   Date:  02/22/2024  ID:  Diana James, Diana James 1981/04/19, MRN [id number] PCP: Patient, No Pcp Per  [facility] Providers Cardiologist:  None     PMH Dyslipidemia IBS Splenomegaly Hepatomegaly Current tobacco use Coronary artery calcification CT calcium score 10/10/23 CAC score 48 LM 0, LAD 0, LCx 0, RCA 48  Seen remotely by Dr. Verlin for palpitations with cardiac monitor that revealed predominant normal sinus rhythm, rare PAC.  Echo with normal LVEF 60 to 65%.  She was drinking several cups of coffee per day and smoking 3 cigarettes/day.  She was encouraged to stop smoking and decrease caffeine intake.  Consider symptoms secondary to GERD and try Pepcid daily.  Seen by Dr. Mona 04/18/2023 for management of dyslipidemia.  Lipids April 2024 showed total cholesterol 180, triglycerides 226, HDL 41, and LDL 100.  She has been on rosuvastatin 5 mg daily and was tolerating the medication for about 6 months.  Lipids however were tested only after 1 month of therapy.  History of IBS as well as splenomegaly and have had a medically with a liver biopsy that was reportedly normal.  She was encouraged to return for NMR and LP(a) for further risk stratification.  History of Present Illness  Discussed the use of AI scribe software for clinical note transcription with the patient, who gave verbal consent to proceed.  History of Present Illness Diana James is a very pleasant 43 year old female who presents for evaluation of recent CT calcium score with calcification noted in RCA. She was referred by her gynecologist. She was previously prescribed rosuvastatin 5 mg daily for elevated cholesterol levels but has not taken it for several months. Her last cholesterol test was in April  2024. She reports her lifestyle includes significant stress from household responsibilities with two teenage daughters and work at the post [employer]. Her diet is high in fast food, red meat, and  potato chips, with alcohol consumption including tequila. She often eats just two meals daily, either eating breakfast or lunch, an afternoon snack and dinner. She is reducing soda intake, opting for sparkling water and coffee, but consumes energy drinks due to fatigue. No significant family history of ASCVD, but there is a history of high blood pressure and high cholesterol. Both parents are still living. She smokes and has struggled to quit despite attempts with patches and gum. Patches caused skin disruption. She is concerned about taking Chantix or Wellbutrin due to potential interactions with her anxiety medication. She does not engage in regular physical activity and her work is sedentary.  She denies chest pain, shortness of breath, lower extremity edema, palpitations, weakness, presyncope, syncope, orthopnea, and PND.   ROS: See HPI  Studies Reviewed EKG Interpretation Date/Time:  Wednesday February 22 2024 11:59:58 EDT Ventricular Rate:  86 PR Interval:  130 QRS Duration:  90 QT Interval:  360 QTC Calculation: 430 R Axis:   0  Text Interpretation: Normal sinus rhythm Cannot rule out Anterior infarct , age undetermined Low voltage QRS on previous ECG No acute ST/T abnormality Confirmed by Diana James [phone]) on 02/22/2024 12:03:48 PM     No results found for: LIPOA  Risk Assessment/Calculations           Physical Exam VS:  BP 128/88   Pulse 97   Ht 5' 4 (1.626 m)   Wt 209 lb (94.8 kg)   SpO2 97%   BMI 35.87 kg/m    Wt Readings from Last  3 Encounters:  02/22/24 209 lb (94.8 kg)  04/18/23 206 lb (93.4 kg)  03/26/22 209 lb 2 oz (94.9 kg)    GEN: Well nourished, well developed in no acute distress NECK: No JVD; No carotid bruits CARDIAC: RRR, no murmurs, rubs, gallops RESPIRATORY:  Clear to auscultation without rales, wheezing or rhonchi  ABDOMEN: Soft, non-tender, non-distended EXTREMITIES:  No edema; No deformity    Assessment & Plan Coronary artery  calcification Cardiac risk Abnormal EKG CT calcium score of 48 with all calcification in the RCA.  She is active at work and raising her family, but no regular exercise. Work is mostly sedentary. She denies chest pain, shortness of breath, palpitations; no symptoms concerning for angina. No family history of early heart disease. Additional risk factors include hyperlipidemia, and smoking. EKG reveals NSR with possible prior anterior infarct, age undetermined. Subtle changes in leads V2-V4 not acutely concerning. We discussed potential testing options. She would like to start with increasing her physical activity and reporting any concerning symptoms. ER precautions advised. -Recommend lifestyle changes including aiming for 150 minutes of moderate intensity exercise weekly -Consider lexiscan myoview if symptoms develop (she does not think she would be a treadmill candidate) -Aim to eat a whole food diet limiting processed foods, saturated fat, sugar and other simple carbohydrates - We discussed smart goals and information was provided to her  Hyperlipidemia LDL goal < 70 No recent lipid panel to review.  She was previously on rosuvastatin 5 mg daily for LDL of 100.  She has not been on rosuvastatin for several months.  No concerning side effects on rosuvastatin. -We will recheck NMR lipoprofile -Check LP(a) for further risk stratification -Advised goal LDL < 70 -Will likely recommend higher intensity statin but will await lab results - Lifestyle modification emphasized including healthy diet and regular exercise  Tobacco use disorder   She continues to smoke despite awareness of risks. Previous cessation attempts with patches and gum were unsuccessful due to anxiety medication interactions and withdrawal symptoms. Quitting smoking remains a significant challenge.  -Discussed smoking cessation strategies including setting a quit date and reducing cigarette intake -Offer referral to support groups  for smoking cessation. - Complete cessation advised  Elevated blood pressure   Diastolic BP is elevated in office today. She does not monitor at home. No history of hypertension except during pregnancy.  -Recommend routine monitoring of home BP to reduce the risk of further cardiovascular disease -Recommend obtaining arm cuff for home monitoring  -Goal BP < 130/80        Dispo: 3-4 months with me  Signed, Rosaline Bane, NP-C

## 2024-02-22 NOTE — Patient Instructions (Addendum)
 Medication Instructions:   Your physician recommends that you continue on your current medications as directed. Please refer to the Current Medication list given to you today.  *If you need a refill on your cardiac medications before your next appointment, please call your pharmacy*   Lab Work:  SOMETIME SOON IN THE NEXT FEW DAYS HERE AT [facility] ON THE 3RD FLOOR ([address])--NMR LIPOPROFILE, LIPOPROTEIN A, AND CMET  If you have labs (blood work) drawn today and your tests are completely normal, you will receive your results only by: MyChart Message (if you have MyChart) OR A paper copy in the mail If you have any lab test that is abnormal or we need to change your treatment, we will call you to review the results.    Follow-Up:  3-4 MONTHS WITH MICHELLE SWINYER NP HERE IN LIPID CLINIC  Other Instructions  PLEASE CONTINUE TRACKING YOUR BLOOD PRESSURES AT HOME WITH YOUR BP GOAL OF 130/80 AND LESS   Adopting a Healthy Lifestyle.   Weight: Know what a healthy weight is for you (roughly BMI <25) and aim to maintain this. You can calculate your body mass index on your smart phone. Unfortunately, this is not the most accurate measure of healthy weight, but it is the simplest measurement to use. A more accurate measurement involves body scanning which measures lean muscle, fat tissue and bony density. We do not have this equipment at [facility].    Diet: Aim for 7+ servings of fruits and vegetables daily Limit animal fats in diet for cholesterol and heart health - choose grass fed whenever available Avoid highly processed foods (fast food burgers, tacos, fried chicken, pizza, hot dogs, french fries)  Saturated fat comes in the form of butter, lard, coconut oil, margarine, partially hydrogenated oils, and fat in meat. These increase your risk of cardiovascular disease.  Use healthy plant oils, such as olive, canola, soy, corn, sunflower and peanut.  Whole foods such as fruits, vegetables  and whole grains have fiber  Men need > 38 grams of fiber per day Women need > 25 grams of fiber per day  Load up on vegetables and fruits - one-half of your plate: Aim for color and variety, and remember that potatoes dont count. Go for whole grains - one-quarter of your plate: Whole wheat, barley, wheat berries, quinoa, oats, brown rice, and foods made with them. If you want pasta, go with whole wheat pasta. Protein power - one-quarter of your plate: Fish, chicken, beans, and nuts are all healthy, versatile protein sources. Limit red meat. You need carbohydrates for energy! The type of carbohydrate is more important than the amount. Choose carbohydrates such as vegetables, fruits, whole grains, beans, and nuts in the place of white rice, white pasta, potatoes (baked or fried), macaroni and cheese, cakes, cookies, and donuts.  If youre thirsty, drink water. Coffee and tea are good in moderation, but skip sugary drinks and limit milk and dairy products to one or two daily servings. Keep sugar intake at 6 teaspoons or 24 grams or LESS       Exercise: Aim for 150 min of moderate intensity exercise weekly for heart health, and weights twice weekly for bone health Stay active - any steps are better than no steps! Aim for 7-9 hours of sleep daily      Mediterranean Diet A Mediterranean diet is based on the traditions of countries on the [employer]. It focuses on eating more: Fruits and vegetables. Whole grains, beans, nuts, and seeds.  Heart-healthy fats. These are fats that are good for your heart. It involves eating less: Dairy. Meat and eggs. Processed foods with added sugar, salt, and fat. This type of diet can help prevent certain conditions. It can also improve outcomes if you have a long-term (chronic) disease, such as kidney or heart disease. What are tips for following this plan? Reading food labels Check packaged foods for: The serving size. For foods such as rice and  pasta, the serving size is the amount of cooked product, not dry. The total fat. Avoid foods with saturated fat or trans fat. Added sugars, such as corn syrup. Shopping  Try to have a balanced diet. Buy a variety of foods, such as: Fresh fruits and vegetables. You may be able to get these from local farmers markets. You can also buy them frozen. Grains, beans, nuts, and seeds. Some of these can be bought in bulk. Fresh seafood. Poultry and eggs. Low-fat dairy products. Buy whole ingredients instead of foods that have already been packaged. If you can't get fresh seafood, buy precooked frozen shrimp or canned fish, such as tuna, salmon, or sardines. Stock your pantry so you always have certain foods on hand, such as olive oil, canned tuna, canned tomatoes, rice, pasta, and beans. Cooking Cook foods with extra-virgin olive oil instead of using butter or other vegetable oils. Have meat as a side dish. Have vegetables or grains as your main dish. This means having meat in small portions or adding small amounts of meat to foods like pasta or stew. Use beans or vegetables instead of meat in common dishes like chili or lasagna. Try out different cooking methods. Try roasting, broiling, steaming, and sauting vegetables. Add frozen vegetables to soups, stews, pasta, or rice. Add nuts or seeds for added healthy fats and plant protein at each meal. You can add these to yogurt, salads, or vegetable dishes. Marinate fish or vegetables using olive oil, lemon juice, garlic, and fresh herbs. Meal planning Plan to eat a vegetarian meal one day each week. Try to work up to two vegetarian meals, if possible. Eat seafood two or more times a week. Have healthy snacks on hand. These may include: Vegetable sticks with hummus. Greek yogurt. Fruit and nut trail mix. Eat balanced meals. These should include: Fruit: 2-3 servings a day. Vegetables: 4-5 servings a day. Low-fat dairy: 2 servings a day. Fish,  poultry, or lean meat: 1 serving a day. Beans and legumes: 2 or more servings a week. Nuts and seeds: 1-2 servings a day. Whole grains: 6-8 servings a day. Extra-virgin olive oil: 3-4 servings a day. Limit red meat and sweets to just a few servings a month. Lifestyle  Try to cook and eat meals with your family. Drink enough fluid to keep your pee (urine) pale yellow. Be active every day. This includes: Aerobic exercise, which is exercise that causes your heart to beat faster. Examples include running and swimming. Leisure activities like gardening, walking, or housework. Get 7-8 hours of sleep each night. Drink red wine if your provider says you can. A glass of wine is 5 oz (150 mL). You may be allowed to have: Up to 1 glass a day if you're female and not pregnant. Up to 2 glasses a day if you're female. What foods should I eat? Fruits Apples. Apricots. Avocado. Berries. Bananas. Cherries. Dates. Figs. Grapes. Lemons. Melon. Oranges. Peaches. Plums. Pomegranate. Vegetables Artichokes. Beets. Broccoli. Cabbage. Carrots. Eggplant. Green beans. Chard. Kale. Spinach. Onions. Leeks. Peas. Squash. Tomatoes.  Peppers. Radishes. Grains Whole-grain pasta. Brown rice. Bulgur wheat. Polenta. Couscous. Whole-wheat bread. [place] Madeira. Meats and other proteins Beans. Almonds. Sunflower seeds. Pine nuts. Peanuts. Cod. Salmon. Scallops. Shrimp. Tuna. Tilapia. Clams. Oysters. Eggs. Chicken or [place] without skin. Dairy Low-fat milk. Cheese. Greek yogurt. Fats and oils Extra-virgin olive oil. Avocado oil. Grapeseed oil. Beverages Water. Red wine. Herbal tea. Sweets and desserts Greek yogurt with honey. Baked apples. Poached pears. Trail mix. Seasonings and condiments Basil. Cilantro. Coriander. Cumin. Mint. Parsley. Sage. Rosemary. Tarragon. Garlic. Oregano. Thyme. Pepper. Balsamic vinegar. Tahini. Hummus. Tomato sauce. Olives. Mushrooms. The items listed above may not be all the foods and drinks  you can have. Talk to a dietitian to learn more. What foods should I limit? This is a list of foods that should be eaten rarely. Fruits Fruit canned in syrup. Vegetables Deep-fried potatoes, like [place] fries. Grains Packaged pasta or rice dishes. Cereal with added sugar. Snacks with added sugar. Meats and other proteins Beef. Pork. Lamb. Chicken or [place] with skin. Hot dogs. [place]. Dairy Ice cream. Sour cream. Whole milk. Fats and oils Butter. Canola oil. Vegetable oil. Beef fat (tallow). Lard. Beverages Juice. Sugar-sweetened soft drinks. Beer. Liquor and spirits. Sweets and desserts Cookies. Cakes. Pies. Candy. Seasonings and condiments Mayonnaise. Pre-made sauces and marinades. The items listed above may not be all the foods and drinks you should limit. Talk to a dietitian to learn more. Where to find more information American Heart Association (AHA): heart.org This information is not intended to replace advice given to you by your health care provider. Make sure you discuss any questions you have with your health care provider. Document Revised: 10/03/2022 Document Reviewed: 10/03/2022 Elsevier Patient Education  2024 [employer].

## 2024-02-27 DIAGNOSIS — E782 Mixed hyperlipidemia: Secondary | ICD-10-CM | POA: Diagnosis not present

## 2024-02-27 DIAGNOSIS — R002 Palpitations: Secondary | ICD-10-CM | POA: Diagnosis not present

## 2024-02-27 DIAGNOSIS — E785 Hyperlipidemia, unspecified: Secondary | ICD-10-CM | POA: Diagnosis not present

## 2024-02-27 DIAGNOSIS — Z79899 Other long term (current) drug therapy: Secondary | ICD-10-CM | POA: Diagnosis not present

## 2024-02-27 LAB — COMPREHENSIVE METABOLIC PANEL WITH GFR
ALT: 18 IU/L (ref 0–32)
AST: 16 IU/L (ref 0–40)
Albumin: 4.5 g/dL (ref 3.9–4.9)
Alkaline Phosphatase: 64 IU/L (ref 44–121)
BUN/Creatinine Ratio: 9 (ref 9–23)
BUN: 8 mg/dL (ref 6–24)
Bilirubin Total: 0.3 mg/dL (ref 0.0–1.2)
CO2: 19 mmol/L — ABNORMAL LOW (ref 20–29)
Calcium: 9.8 mg/dL (ref 8.7–10.2)
Chloride: 102 mmol/L (ref 96–106)
Creatinine, Ser: 0.87 mg/dL (ref 0.57–1.00)
Globulin, Total: 2.7 g/dL (ref 1.5–4.5)
Glucose: 99 mg/dL (ref 70–99)
Potassium: 4 mmol/L (ref 3.5–5.2)
Sodium: 137 mmol/L (ref 134–144)
Total Protein: 7.2 g/dL (ref 6.0–8.5)
eGFR: 85 mL/min/1.73 (ref >59)

## 2024-02-28 ENCOUNTER — Ambulatory Visit: Payer: Self-pay | Admitting: Nurse Practitioner

## 2024-02-28 ENCOUNTER — Other Ambulatory Visit (HOSPITAL_BASED_OUTPATIENT_CLINIC_OR_DEPARTMENT_OTHER): Payer: Self-pay | Admitting: *Deleted

## 2024-02-28 DIAGNOSIS — I2510 Atherosclerotic heart disease of native coronary artery without angina pectoris: Secondary | ICD-10-CM

## 2024-02-28 DIAGNOSIS — E785 Hyperlipidemia, unspecified: Secondary | ICD-10-CM

## 2024-02-28 DIAGNOSIS — Z79899 Other long term (current) drug therapy: Secondary | ICD-10-CM

## 2024-02-28 LAB — NMR, LIPOPROFILE
Cholesterol, Total: 210 mg/dL — ABNORMAL HIGH (ref 100–199)
HDL Particle Number: 37 umol/L (ref >=30.5)
HDL-C: 45 mg/dL (ref >39)
LDL Particle Number: 1587 nmol/L — ABNORMAL HIGH (ref <1000)
LDL Size: 20.1 nm — ABNORMAL LOW (ref >20.5)
LDL-C (NIH Calc): 112 mg/dL — ABNORMAL HIGH (ref 0–99)
LP-IR Score: 87 — ABNORMAL HIGH (ref <=45)
Small LDL Particle Number: 980 nmol/L — ABNORMAL HIGH (ref <=527)
Triglycerides: 305 mg/dL — ABNORMAL HIGH (ref 0–149)

## 2024-02-28 LAB — LIPOPROTEIN A (LPA): Lipoprotein (a): 29 nmol/L (ref <75.0)

## 2024-02-28 MED ORDER — ROSUVASTATIN CALCIUM 10 MG PO TABS: 10.0000 mg | ORAL_TABLET | Freq: Every day | ORAL | 3 refills | Status: AC

## 2024-06-04 NOTE — Progress Notes (Unsigned)
 Cardiology Office Note   Date:  06/06/2024  ID:  Diana James, DOB Aug 05, 1980, MRN [id number] PCP: Patient, No Pcp Per  [facility] Providers Cardiologist:  None     PMH Dyslipidemia IBS Splenomegaly Hepatomegaly Current tobacco use Coronary artery calcification CT calcium  score 10/10/23 CAC score 48 LM 0, LAD 0, LCx 0, RCA 48  Seen remotely by Dr. Verlin for palpitations with cardiac monitor that revealed predominant normal sinus rhythm, rare PAC.  Echo with normal LVEF 60 to 65%.  She was drinking several cups of coffee per day and smoking 3 cigarettes/day.  She was encouraged to stop smoking and decrease caffeine intake.  Consider symptoms secondary to GERD and try Pepcid daily.  Seen by Dr. Mona 04/18/2023 for management of dyslipidemia.  Lipids April 2024 showed total cholesterol 180, triglycerides 226, HDL 41, and LDL 100.  She has been on rosuvastatin  5 mg daily and was tolerating the medication for about 6 months.  Lipids however were tested only after 1 month of therapy.  History of IBS as well as splenomegaly and have had a medically with a liver biopsy that was reportedly normal.  She was encouraged to return for NMR and LP(a) for further risk stratification.  Seen by me on 02/22/24 for evaluation of recent CT calcium  score with calcification noted in RCA. Previously prescribed rosuvastatin  5 mg daily for elevated cholesterol levels but has not taken it for several months. Her last cholesterol test was in April  2024. Reported her lifestyle includes significant stress from household responsibilities with two teenage daughters and work at the post [employer]. Her diet is high in fast food, red meat, and potato chips, with alcohol consumption including tequila. She often eats just two meals daily, either eating breakfast or lunch, an afternoon snack and dinner. She is reducing soda intake, opting for sparkling water and coffee, but consumes energy drinks due to fatigue. No  significant family history of ASCVD, but there is a history of high blood pressure and high cholesterol. Both parents are still living. She smokes and has struggled to quit despite attempts with patches and gum. Patches caused skin disruption. She is concerned about taking Chantix or Wellbutrin due to potential interactions with her anxiety medication. She does not engage in regular physical activity and her work is sedentary.  No chest pain, shortness of breath, lower extremity edema, palpitations, weakness, presyncope, syncope, orthopnea, and PND.   LP(a) is not elevated. NMR completed 02/27/24 revealed LDL particle number 1587, LDL-C 112, HDL-C 45, triglycerides 305, total cholesterol 210, and small LDL-P 980.  She was advised to start rosuvastatin  10 mg daily and return in 3 months for repeat lipid testing.  Also to make lifestyle modifications for healthier diet, and regular exercise.  History of Present Illness  Discussed the use of AI scribe software for clinical note transcription with the patient, who gave verbal consent to proceed.  History of Present Illness Diana James is a very pleasant 43 year old female who presents for follow-up of hyperlipidemia and CAD. She has a resting heart rate around 99 bpm on her Apple Watch, rising to 140 during panic attacks. She denies lightheadedness or palpitations. She consumes 250 to 400 mg of caffeine daily. She takes rosuvastatin  10 mg for cholesterol without adverse effects but has not had recent lab work. She smokes fewer cigarettes but vapes more. She could not tolerate nicotine patches because of skin reactions and nicotine gum was ineffective. She has avoided Chantix and  Wellbutrin due to possible interactions with Effexor. Recent weight loss that she attributes to stress and being more active, with plans to further increase physical activity. She has frequent sweating and feeling hot since having children. She is not on birth control and has had  tubal ligation. Thyroid  function recently checked by gynecology was normal.  She denies chest pain, shortness of breath, orthopnea, PND, edema, presyncope, syncope.   ROS: See HPI  Studies Reviewed       Lipoprotein (a)  Date/Time Value Ref Range Status  02/27/2024 09:26 AM 29.0 <75.0 nmol/L Final    Comment:    Note:  Values greater than or equal to 75.0 nmol/L may        indicate an independent risk factor for CHD,        but must be evaluated with caution when applied        to non-Caucasian populations due to the        influence of genetic factors on Lp(a) across        ethnicities.     Risk Assessment/Calculations           Physical Exam VS:  BP 120/84   Pulse (!) 108   Ht 5' 4 (1.626 m)   Wt 207 lb 8 oz (94.1 kg)   SpO2 99%   BMI 35.62 kg/m    Wt Readings from Last 3 Encounters:  06/06/24 207 lb 8 oz (94.1 kg)  02/22/24 209 lb (94.8 kg)  04/18/23 206 lb (93.4 kg)    GEN: Well nourished, well developed in no acute distress NECK: No JVD; No carotid bruits CARDIAC: RRR, no murmurs, rubs, gallops RESPIRATORY:  Clear to auscultation without rales, wheezing or rhonchi  ABDOMEN: Soft, non-tender, non-distended EXTREMITIES:  No edema; No deformity    Assessment & Plan Coronary artery calcification Cardiac risk Abnormal EKG CT calcium  score of 48 with all calcification in the RCA. She has been increasing activity recently.  She denies chest pain, shortness of breath, palpitations; no symptoms concerning for angina. No family history of early heart disease. Additional risk factors include hyperlipidemia, and smoking. EKG on 02/22/24 revealed NSR with possible prior anterior infarct, age undetermined. Subtle changes in leads V2-V4 not acutely concerning.  Lengthy discussion about lifestyle modification to incorporate more regular exercise and heart healthy diet.  Recommendation to stop smoking. -Recommend lifestyle changes including aiming for 150 minutes of moderate  intensity exercise weekly -Aim to eat a whole food diet limiting processed foods, saturated fat, sugar and other simple carbohydrates - Smoking cessation advised  Hyperlipidemia LDL goal < 70 LP(a) is not elevated.  She was advised to increase rosuvastatin  from 5 mg daily to 10 mg daily due to elevated LDL-C of 112, LDL particle number of 1587, small LDL-P of 980, and triglycerides of 305 on labs completed 02/27/2024. No concerning side effects on rosuvastatin .  Will update lipid testing for surveillance. -We will recheck NMR lipoprofile today as she is fasting - Consider additional lipid-lowering therapy for LDL goal 70 or lower - Lifestyle modification emphasized including healthy diet and regular exercise  Tobacco use disorder   She continues to smoke but has shifted from smoking cigarettes to vaping more.  Previous cessation attempts with patches and gum were unsuccessful.  She has avoided Chantix and Wellbutrin due to concerns about interaction with anti-anxiety medications.   -Discussed smoking cessation strategies including setting a quit date and reducing cigarette intake -Offer referral to support groups for smoking cessation. -  Complete cessation advised  Sinus tachycardia   Resting heart rate averages 99 bpm according to Apple watch. She denies associated symptoms. Discussed possible contributing factors including inactivity and caffeine intake. Reports normal  thyroid  function on recent testing with gynecologist.  - Recommend that she increase physical activity, aiming for at least 150 minutes of moderate exercise per week - Monitor heart rate and report in one month - Consider additional lab testing, potential echo, cardiac monitor if HR remains elevated - Limit caffeine intake to ~ 200 mg per day        Disposition: 6 months with me  Signed, Rosaline Bane, NP-C

## 2024-06-06 ENCOUNTER — Ambulatory Visit (HOSPITAL_BASED_OUTPATIENT_CLINIC_OR_DEPARTMENT_OTHER): Admitting: Nurse Practitioner

## 2024-06-06 ENCOUNTER — Encounter (HOSPITAL_BASED_OUTPATIENT_CLINIC_OR_DEPARTMENT_OTHER): Payer: Self-pay | Admitting: Nurse Practitioner

## 2024-06-06 VITALS — BP 120/84 | HR 108 | Ht 64.0 in | Wt 207.5 lb

## 2024-06-06 DIAGNOSIS — I2510 Atherosclerotic heart disease of native coronary artery without angina pectoris: Secondary | ICD-10-CM

## 2024-06-06 DIAGNOSIS — R000 Tachycardia, unspecified: Secondary | ICD-10-CM

## 2024-06-06 DIAGNOSIS — Z7189 Other specified counseling: Secondary | ICD-10-CM | POA: Diagnosis not present

## 2024-06-06 DIAGNOSIS — Z720 Tobacco use: Secondary | ICD-10-CM

## 2024-06-06 DIAGNOSIS — Z79899 Other long term (current) drug therapy: Secondary | ICD-10-CM

## 2024-06-06 DIAGNOSIS — R9431 Abnormal electrocardiogram [ECG] [EKG]: Secondary | ICD-10-CM

## 2024-06-06 DIAGNOSIS — E785 Hyperlipidemia, unspecified: Secondary | ICD-10-CM | POA: Diagnosis not present

## 2024-06-06 NOTE — Patient Instructions (Signed)
 Medication Instructions:   Your physician recommends that you continue on your current medications as directed. Please refer to the Current Medication list given to you today.   *If you need a refill on your cardiac medications before your next appointment, please call your pharmacy*  Lab Work:  TODAY!!!! NMR/ALT  If you have labs (blood work) drawn today and your tests are completely normal, you will receive your results only by: MyChart Message (if you have MyChart) OR A paper copy in the mail If you have any lab test that is abnormal or we need to change your treatment, we will call you to review the results.  Testing/Procedures:  None ordered.  Follow-Up: At [facility], you and your health needs are our priority.  As part of our continuing mission to provide you with exceptional heart care, our providers are all part of one team.  This team includes your primary Cardiologist (physician) and Advanced Practice Providers or APPs (Physician Assistants and Nurse Practitioners) who all work together to provide you with the care you need, when you need it.  Your next appointment:   6 month(s)  Provider:   Rosaline Bane, NP    We recommend signing up for the patient portal called MyChart.  Sign up information is provided on this After Visit Summary.  MyChart is used to connect with patients for Virtual Visits (Telemedicine).  Patients are able to view lab/test results, encounter notes, upcoming appointments, etc.  Non-urgent messages can be sent to your provider as well.   To learn more about what you can do with MyChart, go to [email or web address].   Other Instructions  Your physician wants you to follow-up in: 6 months.  You will receive a reminder letter in the mail two months in advance. If you don't receive a letter, please call our office to schedule the follow-up appointment.   Please send in some Heart Rate ( Pulse) readings after Christmas.  Please  limit  your caffeine intake.        Adopting a Healthy Lifestyle.   Weight: Know what a healthy weight is for you (roughly BMI <25) and aim to maintain this. You can calculate your body mass index on your smart phone. Unfortunately, this is not the most accurate measure of healthy weight, but it is the simplest measurement to use. A more accurate measurement involves body scanning which measures lean muscle, fat tissue and bony density. We do not have this equipment at [facility].    Diet: Aim for 7+ servings of fruits and vegetables daily Limit animal fats in diet for cholesterol and heart health - choose grass fed whenever available Avoid highly processed foods (fast food burgers, tacos, fried chicken, pizza, hot dogs, french fries)  Saturated fat comes in the form of butter, lard, coconut oil, margarine, partially hydrogenated oils, dairy products, and fat in meat. These increase your risk of cardiovascular disease.  Use healthy plant oils, such as olive, canola, soy, corn, sunflower and peanut.  Whole foods such as fruits, vegetables and whole grains have fiber  Men need > 38 grams of fiber per day Women need > 25 grams of fiber per day  Load up on vegetables and fruits - one-half of your plate: Aim for color and variety, and remember that potatoes dont count. Go for whole grains - one-quarter of your plate: Whole wheat, barley, wheat berries, quinoa, oats, brown rice, and foods made with them. If you want pasta, go with whole wheat pasta. Protein power -  one-quarter of your plate: Fish, chicken, beans, and nuts are all healthy, versatile protein sources. Limit red meat. You need carbohydrates for energy! The type of carbohydrate is more important than the amount. Choose carbohydrates such as vegetables, fruits, whole grains, beans, and nuts in the place of white rice, white pasta, potatoes (baked or fried), macaroni and cheese, cakes, cookies, and donuts.  If youre thirsty, drink water. Coffee and  tea are good in moderation, but skip sugary drinks and limit milk and dairy products to one or two daily servings. Keep sugar intake at 6 teaspoons or 24 grams or LESS       Exercise: Aim for 150 min of moderate intensity exercise weekly for heart health, and weights twice weekly for bone health Stay active - any steps are better than no steps! Aim for 7-9 hours of sleep daily   Sleep: This provides your body with the reset and relaxation that it needs!  Aim to get 7-8 hours of sleep each night. Limit caffeine, screen time, and other distractions prior to bedtime.  Keep your bedroom cool and dark and do not wear heavy clothing to bed or use heavy bed covers - layer if needed.

## 2024-06-07 ENCOUNTER — Ambulatory Visit (HOSPITAL_BASED_OUTPATIENT_CLINIC_OR_DEPARTMENT_OTHER): Payer: Self-pay | Admitting: Nurse Practitioner

## 2024-06-07 LAB — NMR, LIPOPROFILE
Cholesterol, Total: 162 mg/dL (ref 100–199)
HDL Particle Number: 47.3 umol/L (ref >=30.5)
HDL-C: 50 mg/dL (ref >39)
LDL Particle Number: 836 nmol/L (ref <1000)
LDL Size: 19.6 nm — ABNORMAL LOW (ref >20.5)
LDL-C (NIH Calc): 73 mg/dL (ref 0–99)
LP-IR Score: 59 — ABNORMAL HIGH (ref <=45)
Small LDL Particle Number: 729 nmol/L — ABNORMAL HIGH (ref <=527)
Triglycerides: 243 mg/dL — ABNORMAL HIGH (ref 0–149)

## 2024-06-07 LAB — ALT: ALT: 21 IU/L (ref 0–32)

## 2024-06-25 ENCOUNTER — Ambulatory Visit

## 2024-06-25 VITALS — Ht 64.0 in | Wt 204.0 lb

## 2024-06-25 DIAGNOSIS — Z83719 Family history of colon polyps, unspecified: Secondary | ICD-10-CM

## 2024-06-25 MED ORDER — NA SULFATE-K SULFATE-MG SULF 17.5-3.13-1.6 GM/177ML PO SOLN: 1.0000 | Freq: Once | ORAL | 0 refills | Status: AC

## 2024-06-25 NOTE — Progress Notes (Signed)
 PCP MD at time of PV: NO PCP  __________________________________________________________________________________________________________________________________________  No egg allergy known to patient  No soy allergy known to patient No issues known to pt with past sedation with any surgeries or procedures Patient denies ever being told they had issues or difficulty with intubation  No FH of Malignant Hyperthermia Pt is not on diet pills Pt is not on  home 02  Pt is not on blood thinners  No A fib or A flutter Have any cardiac testing pending--no  LOA: independent  No Chew or Snuff tobacco __________________________________________________________________________________________________________________________________________  Constipation: no  Prep: suprep  __________________________________________________________________________________________________________________________________________  PV completed with patient. Prep instructions reviewed and provided during apt. Rx sent to preferred pharmacy.  __________________________________________________________________________________________________________________________________________  Patient's chart reviewed by Norleen Schillings CNRA prior to previsit and patient appropriate for the [facility].  Previsit completed and red dot placed by patient's name on their procedure day (on provider's schedule).

## 2024-07-06 ENCOUNTER — Encounter: Payer: Self-pay | Admitting: Pediatrics

## 2024-07-08 NOTE — Progress Notes (Unsigned)
 [facility] History and Physical   Primary Care Physician:  Patient, No Pcp Per   Reason for Procedure:  Colorectal cancer screening  Plan:    Colonoscopy   The patient was provided an opportunity to ask questions and all were answered. The patient agreed with the plan.   HPI: Diana James is a 44 y.o. female undergoing screening colonoscopy for colorectal cancer screening.  Patient underwent colonoscopy in 2020 which disclosed hyperplastic polyps.  There is a pertinent family history of patient's mother having colon polyps-histology unknown.  Patient denies symptoms of rectal bleeding or change in bowel habits at the time of today's exam.   Past Medical History:  Diagnosis Date   Abnormal uterine bleeding (AUB)    Allergic rhinitis    Anal fissure    Anxiety    Chronic headaches    Colon polyps    Environmental allergies    GERD (gastroesophageal reflux disease)    History of colon polyps    02/ 2020   History of gastritis    02/ 2020 and esophagitis   History of migraine    History of palpitations    pt evalutated by cardiology, dr verlin, note 09-20-2012 in epic;  event monitor 08-30-2012 showed NSR w/ rare PAC and echo 08-30-2012 ef 60-65% and trivial MR;   non-cardiac   Hypertension    IBS (irritable bowel syndrome)    IC (interstitial cystitis)    per pt no treatment or seen urologist in years   Migraine    Wears contact lenses     Past Surgical History:  Procedure Laterality Date   CESAREAN SECTION  02-27-2007; 10-07- 2010   @[facility]    COLONOSCOPY WITH ESOPHAGOGASTRODUODENOSCOPY (EGD)  08-15-2018   dr eda   LAPAROSCOPIC TUBAL LIGATION  08/30/2019   Procedure: LAPAROSCOPIC TUBAL LIGATION;  Surgeon: Leva Rush, MD;  Location[facility];  Service: Gynecology;;   LAPAROSCOPY N/A 08/30/2019   Procedure: LAPAROSCOPY DIAGNOSTIC;  Surgeon: Leva Rush, MD;  Location: [facility] [place];  Service: Gynecology;  Laterality: N/A;    TONSILLECTOMY  2000    Prior to Admission medications  Medication Sig Start Date End Date Taking? Authorizing Provider  cetirizine (ZYRTEC) 10 MG tablet Take 10 mg by mouth at bedtime.  Patient not taking: Reported on 06/25/2024    [provider]  ibuprofen  (ADVIL ) 200 MG tablet Take 200 mg by mouth every 6 (six) hours as needed.    [provider]  ondansetron  (ZOFRAN ) 8 MG tablet Take 1 tablet (8 mg total) by mouth every 8 (eight) hours as needed for nausea or vomiting. 03/26/22   Beather Delon Gibson, PA  rosuvastatin  (CRESTOR ) 10 MG tablet Take 1 tablet (10 mg total) by mouth daily. 02/28/24 06/25/24  Swinyer, Rosaline CHRISTELLA, NP  venlafaxine (EFFEXOR) 75 MG tablet Take 75 mg by mouth at bedtime.     [provider]    Current Outpatient Medications  Medication Sig Dispense Refill   ibuprofen  (ADVIL ) 200 MG tablet Take 200 mg by mouth every 6 (six) hours as needed.     ondansetron  (ZOFRAN ) 8 MG tablet Take 1 tablet (8 mg total) by mouth every 8 (eight) hours as needed for nausea or vomiting. 90 tablet 0   rosuvastatin  (CRESTOR ) 10 MG tablet Take 1 tablet (10 mg total) by mouth daily. 90 tablet 3   venlafaxine (EFFEXOR) 75 MG tablet Take 75 mg by mouth at bedtime.      cetirizine (ZYRTEC) 10 MG  tablet Take 10 mg by mouth at bedtime.  (Patient not taking: No sig reported)     Current Facility-Administered Medications  Medication Dose Route Frequency Provider Last Rate Last Admin   0.9 %  sodium chloride  infusion  500 mL Intravenous Continuous Seniya Stoffers, Inocente HERO, MD        Allergies as of 07/09/2024 - Review Complete 07/09/2024  Allergen Reaction Noted   Cefdinir Hives 02/07/2014   Penicillins Hives 01/13/2018    Family History  Problem Relation Age of Onset   Hypertension Mother    Hyperlipidemia Mother    Mental illness Mother        Borderline personality d/o, bipolar, manic depressive   Celiac disease Mother    Thyroid  disease Mother    Colon polyps  Mother    Diabetes Mother    Irritable bowel syndrome Mother    Cancer - Other Father        Throat   Diabetes Paternal Grandfather    CAD Neg Hx    Neuromuscular disorder Neg Hx    Stomach cancer Neg Hx    Esophageal cancer Neg Hx    Colon cancer Neg Hx    Rectal cancer Neg Hx     Social History   Socioeconomic History   Marital status: Married    Spouse name: Not on file   Number of children: 2   Years of education: 10   Highest education level: Not on file  Occupational History   Occupation: Herbalist   Occupation: human resources speacialist  Tobacco Use   Smoking status: Every Day    Current packs/day: 0.50    Average packs/day: 0.5 packs/day for 25.0 years (12.5 ttl pk-yrs)    Types: Cigarettes   Smokeless tobacco: Never   Tobacco comments:    02/22/2024 Patient smokes a half a pack daily  Vaping Use   Vaping status: Every Day   Last attempt to quit: 02/20/2020  Substance and Sexual Activity   Alcohol use: Yes    Alcohol/week: 7.0 standard drinks of alcohol    Types: 7 Standard drinks or equivalent per week    Comment: 1 drink per day   Drug use: Never   Sexual activity: Yes    Birth control/protection: I.U.D.    Comment: Tubal ligation  Other Topics Concern   Not on file  Social History Narrative   Work or School: [employer]      Home Situation: lives with husband and two children      Spiritual Beliefs: none      Lifestyle: no regular exercise; diet is terrible      Right-handed      Caffeine: coffee, tea and sodas throughout the day      Social Drivers of Health   Tobacco Use: High Risk (07/09/2024)   Patient History    Smoking Tobacco Use: Every Day    Smokeless Tobacco Use: Never    Passive Exposure: Not on file  Financial Resource Strain: Not on file  Food Insecurity: Not on file  Transportation Needs: Not on file  Physical Activity: Not on file  Stress: Not on file  Social Connections: Not on file  Intimate  Partner Violence: Not on file  Depression (EYV7-0): Not on file  Alcohol Screen: Not on file  Housing: Not on file  Utilities: Not on file  Health Literacy: Not on file    Review of Systems:  All other review of systems negative except as mentioned in  the HPI.  Physical Exam: Vital signs BP (!) 140/88   Pulse 89   Temp (!) 97.3 F (36.3 C) (Temporal)   Ht 5' 4 (1.626 m)   Wt 204 lb (92.5 kg)   SpO2 97%   BMI 35.02 kg/m   General:   Alert,  Well-developed, well-nourished, pleasant and cooperative in NAD Airway:  Mallampati 1 Lungs:  Clear throughout to auscultation.   Heart:  Regular rate and rhythm; no murmurs, clicks, rubs,  or gallops. Abdomen:  Soft, nontender and nondistended. Normal bowel sounds.   Neuro/Psych:  Normal mood and affect. A and O x 3  Inocente Hausen, MD [facility]

## 2024-07-09 ENCOUNTER — Encounter: Payer: Self-pay | Admitting: Pediatrics

## 2024-07-09 ENCOUNTER — Ambulatory Visit: Admitting: Pediatrics

## 2024-07-09 VITALS — BP 119/70 | HR 80 | Temp 97.3°F | Resp 13 | Ht 64.0 in | Wt 204.0 lb

## 2024-07-09 DIAGNOSIS — K648 Other hemorrhoids: Secondary | ICD-10-CM | POA: Diagnosis not present

## 2024-07-09 DIAGNOSIS — K621 Rectal polyp: Secondary | ICD-10-CM | POA: Diagnosis not present

## 2024-07-09 DIAGNOSIS — Z1211 Encounter for screening for malignant neoplasm of colon: Secondary | ICD-10-CM

## 2024-07-09 DIAGNOSIS — Z83719 Family history of colon polyps, unspecified: Secondary | ICD-10-CM | POA: Diagnosis not present

## 2024-07-09 DIAGNOSIS — D128 Benign neoplasm of rectum: Secondary | ICD-10-CM

## 2024-07-09 MED ORDER — SODIUM CHLORIDE 0.9 % IV SOLN: 500.0000 mL | INTRAVENOUS | Status: AC

## 2024-07-09 NOTE — Op Note (Signed)
 [facility] Patient Name: Diana James Procedure Date: 07/09/2024 1:37 PM MRN: [id number] Endoscopist: Inocente Hausen , MD, [id number] Age: 44 Referring MD:  Date of Birth: 1981-04-25 Gender: Female Account #: [id number] Procedure:                Colonoscopy Indications:              Colon cancer screening in patient at increased                            risk: Family history of 1st-degree relative with                            colon polyps, Last colonoscopy: February 2020 Medicines:                Monitored Anesthesia Care Procedure:                Pre-Anesthesia Assessment:                           - Prior to the procedure, a History and Physical                            was performed, and patient medications and                            allergies were reviewed. The patient's tolerance of                            previous anesthesia was also reviewed. The risks                            and benefits of the procedure and the sedation                            options and risks were discussed with the patient.                            All questions were answered, and informed consent                            was obtained. Prior Anticoagulants: The patient has                            taken no anticoagulant or antiplatelet agents. ASA                            Grade Assessment: II - A patient with mild systemic                            disease. After reviewing the risks and benefits,                            the patient was deemed in satisfactory condition to  undergo the procedure.                           After obtaining informed consent, the colonoscope                            was passed under direct vision. Throughout the                            procedure, the patient's blood pressure, pulse, and                            oxygen saturations were monitored continuously. The                            CF [id number] #[id number]  was introduced through the anus                            and advanced to the cecum, identified by                            appendiceal orifice and ileocecal valve. The                            colonoscopy was performed without difficulty. The                            patient tolerated the procedure well. The quality                            of the bowel preparation was good. The ileocecal                            valve, appendiceal orifice, and rectum were                            photographed. Scope In: 2:10:31 PM Scope Out: 2:25:10 PM Scope Withdrawal Time: 0 hours 11 minutes 38 seconds  Total Procedure Duration: 0 hours 14 minutes 39 seconds  Findings:                 The perianal and digital rectal examinations were                            normal. Pertinent negatives include normal                            sphincter tone and no palpable rectal lesions.                           Two sessile polyps were found in the rectum. The                            polyps were 3 to 4 mm in size. These polyps were  removed with a cold biopsy forceps. Resection and                            retrieval were complete.                           Internal hemorrhoids were found during retroflexion. Complications:            No immediate complications. Estimated blood loss:                            Minimal. Estimated Blood Loss:     Estimated blood loss was minimal. Impression:               - Two 3 to 4 mm polyps in the rectum, removed with                            a cold biopsy forceps. Resected and retrieved.                           - Internal hemorrhoids. Recommendation:           - Discharge patient to home (ambulatory).                           - Await pathology results.                           - Repeat colonoscopy for surveillance based on                            pathology results. If patient's mother has a                             history of advanced adenoma (tubular adenoma > 1                            cm, tubulovillous adenoma or adenoma with high                            grade dysplasia) repeat colonoscopy in 5 years.                           - The findings and recommendations were discussed                            with the patient's family.                           - Patient has a contact number available for                            emergencies. The signs and symptoms of potential                            delayed complications were discussed with  the                            patient. Return to normal activities tomorrow.                            Written discharge instructions were provided to the                            patient. Inocente Hausen, MD 07/09/2024 2:30:34 PM This report has been signed electronically.

## 2024-07-09 NOTE — Patient Instructions (Signed)
 YOU HAD AN ENDOSCOPIC PROCEDURE TODAY AT THE [facility]:   Refer to the procedure report that was given to you for any specific questions about what was found during the examination.  If the procedure report does not answer your questions, please call your gastroenterologist to clarify.  If you requested that your care partner not be given the details of your procedure findings, then the procedure report has been included in a sealed envelope for you to review at your convenience later.  YOU SHOULD EXPECT: Some feelings of bloating in the abdomen. Passage of more gas than usual.  Walking can help get rid of the air that was put into your GI tract during the procedure and reduce the bloating. If you had a lower endoscopy (such as a colonoscopy or flexible sigmoidoscopy) you may notice spotting of blood in your stool or on the toilet paper. If you underwent a bowel prep for your procedure, you may not have a normal bowel movement for a few days.  Please Note:  You might notice some irritation and congestion in your nose or some drainage.  This is from the oxygen used during your procedure.  There is no need for concern and it should clear up in a day or so.  SYMPTOMS TO REPORT IMMEDIATELY:  Following lower endoscopy (colonoscopy or flexible sigmoidoscopy):  Excessive amounts of blood in the stool  Significant tenderness or worsening of abdominal pains  Swelling of the abdomen that is new, acute  Fever of 100F or higher   For urgent or emergent issues, a gastroenterologist can be reached at any hour by calling [phone]. Do not use MyChart messaging for urgent concerns.    DIET:  We do recommend a small meal at first, but then you may proceed to your regular diet.  Drink plenty of fluids but you should avoid alcoholic beverages for 24 hours.  MEDICATIONS: Continue present medications.  FOLLOW UP: Await pathology results. Repeat colonoscopy for surveillance based on pathology  results.  Educational handouts given yo patient: Polyps, Hemorrhoids.  Thank you for allowing us  to provide for your healthcare needs today.  ACTIVITY:  You should plan to take it easy for the rest of today and you should NOT DRIVE or use heavy machinery until tomorrow (because of the sedation medicines used during the test).    FOLLOW UP: Our staff will call the number listed on your records the next business day following your procedure.  We will call around 7:15- 8:00 am to check on you and address any questions or concerns that you may have regarding the information given to you following your procedure. If we do not reach you, we will leave a message.     If any biopsies were taken you will be contacted by phone or by letter within the next 1-3 weeks.  Please call us  at [phone] if you have not heard about the biopsies in 3 weeks.    SIGNATURES/CONFIDENTIALITY: You and/or your care partner have signed paperwork which will be entered into your electronic medical record.  These signatures attest to the fact that that the information above on your After Visit Summary has been reviewed and is understood.  Full responsibility of the confidentiality of this discharge information lies with you and/or your care-partner.

## 2024-07-09 NOTE — Progress Notes (Signed)
 Pt's states no medical or surgical changes since previsit or office visit.

## 2024-07-09 NOTE — Progress Notes (Signed)
 Transferred to PACU via stretcher.  Not responding to stimulation at this time.  VSS upon leaving procedure room.

## 2024-07-09 NOTE — Progress Notes (Signed)
 Called to room to assist during endoscopic procedure.  Patient ID and intended procedure confirmed with present staff. Received instructions for my participation in the procedure from the performing physician.

## 2024-07-10 ENCOUNTER — Telehealth: Payer: Self-pay | Admitting: *Deleted

## 2024-07-10 NOTE — Telephone Encounter (Signed)
" °  Follow up Call-     07/09/2024   12:28 PM  Call back number  Post procedure Call Back phone  # [phone]  Permission to leave phone message Yes    Post procedure follow up phone call. No answer at number given.  Left message on voicemail.  "

## 2024-07-12 ENCOUNTER — Ambulatory Visit: Payer: Self-pay | Admitting: Pediatrics

## 2024-07-12 LAB — SURGICAL PATHOLOGY
